# Patient Record
Sex: Female | Born: 1995
Health system: Southern US, Community
[De-identification: ages and names within clinical notes are randomized; demographics above are authoritative.]

## PROBLEM LIST (undated history)

## (undated) DIAGNOSIS — F32A Depression, unspecified: Secondary | ICD-10-CM

## (undated) DIAGNOSIS — O24419 Gestational diabetes mellitus in pregnancy, unspecified control: Secondary | ICD-10-CM

## (undated) DIAGNOSIS — F909 Attention-deficit hyperactivity disorder, unspecified type: Secondary | ICD-10-CM

## (undated) DIAGNOSIS — F419 Anxiety disorder, unspecified: Secondary | ICD-10-CM

## (undated) HISTORY — DX: Anxiety disorder, unspecified: F41.9

## (undated) HISTORY — DX: Attention-deficit hyperactivity disorder, unspecified type: F90.9

## (undated) HISTORY — DX: Gestational diabetes mellitus in pregnancy, unspecified control: O24.419

## (undated) HISTORY — DX: Depression, unspecified: F32.A

---
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
## 2017-07-24 DIAGNOSIS — Z3042 Encounter for surveillance of injectable contraceptive: Secondary | ICD-10-CM | POA: Diagnosis not present

## 2017-08-14 DIAGNOSIS — F419 Anxiety disorder, unspecified: Secondary | ICD-10-CM | POA: Diagnosis not present

## 2017-08-14 DIAGNOSIS — B359 Dermatophytosis, unspecified: Secondary | ICD-10-CM | POA: Diagnosis not present

## 2017-09-26 DIAGNOSIS — N3001 Acute cystitis with hematuria: Secondary | ICD-10-CM | POA: Diagnosis not present

## 2017-09-26 DIAGNOSIS — Z6820 Body mass index (BMI) 20.0-20.9, adult: Secondary | ICD-10-CM | POA: Diagnosis not present

## 2017-09-26 DIAGNOSIS — R350 Frequency of micturition: Secondary | ICD-10-CM | POA: Diagnosis not present

## 2017-09-26 DIAGNOSIS — Z9109 Other allergy status, other than to drugs and biological substances: Secondary | ICD-10-CM | POA: Diagnosis not present

## 2017-10-09 DIAGNOSIS — Z3042 Encounter for surveillance of injectable contraceptive: Secondary | ICD-10-CM | POA: Diagnosis not present

## 2018-01-13 DIAGNOSIS — Z3042 Encounter for surveillance of injectable contraceptive: Secondary | ICD-10-CM | POA: Diagnosis not present

## 2018-02-01 DIAGNOSIS — J069 Acute upper respiratory infection, unspecified: Secondary | ICD-10-CM | POA: Diagnosis not present

## 2018-04-11 DIAGNOSIS — F419 Anxiety disorder, unspecified: Secondary | ICD-10-CM | POA: Diagnosis not present

## 2018-04-14 DIAGNOSIS — Z3042 Encounter for surveillance of injectable contraceptive: Secondary | ICD-10-CM | POA: Diagnosis not present

## 2018-04-17 DIAGNOSIS — G44209 Tension-type headache, unspecified, not intractable: Secondary | ICD-10-CM | POA: Diagnosis not present

## 2018-05-01 DIAGNOSIS — G44209 Tension-type headache, unspecified, not intractable: Secondary | ICD-10-CM | POA: Diagnosis not present

## 2018-05-01 DIAGNOSIS — Z23 Encounter for immunization: Secondary | ICD-10-CM | POA: Diagnosis not present

## 2018-05-24 DIAGNOSIS — F419 Anxiety disorder, unspecified: Secondary | ICD-10-CM | POA: Diagnosis not present

## 2018-05-24 DIAGNOSIS — G44209 Tension-type headache, unspecified, not intractable: Secondary | ICD-10-CM | POA: Diagnosis not present

## 2018-06-27 DIAGNOSIS — R51 Headache: Secondary | ICD-10-CM | POA: Diagnosis not present

## 2018-06-30 DIAGNOSIS — Z3042 Encounter for surveillance of injectable contraceptive: Secondary | ICD-10-CM | POA: Diagnosis not present

## 2018-06-30 DIAGNOSIS — Z113 Encounter for screening for infections with a predominantly sexual mode of transmission: Secondary | ICD-10-CM | POA: Diagnosis not present

## 2018-07-12 ENCOUNTER — Ambulatory Visit: Payer: BLUE CROSS/BLUE SHIELD | Admitting: Neurology

## 2018-09-13 ENCOUNTER — Ambulatory Visit: Payer: BLUE CROSS/BLUE SHIELD | Admitting: Neurology

## 2018-10-13 DIAGNOSIS — Z3042 Encounter for surveillance of injectable contraceptive: Secondary | ICD-10-CM | POA: Diagnosis not present

## 2018-11-07 ENCOUNTER — Telehealth: Payer: Self-pay

## 2018-11-07 NOTE — Telephone Encounter (Signed)
I contacted the pt and left a vm asking pt to call me back so we could update her chart for 11/08/18 visit with Dr. Terrace Arabia.   [facility] # provided and chart updated to referral paper work sent.

## 2018-11-08 ENCOUNTER — Other Ambulatory Visit: Payer: Self-pay

## 2018-11-08 ENCOUNTER — Ambulatory Visit (INDEPENDENT_AMBULATORY_CARE_PROVIDER_SITE_OTHER): Payer: BLUE CROSS/BLUE SHIELD | Admitting: Neurology

## 2018-11-08 DIAGNOSIS — IMO0002 Reserved for concepts with insufficient information to code with codable children: Secondary | ICD-10-CM | POA: Insufficient documentation

## 2018-11-08 DIAGNOSIS — G43709 Chronic migraine without aura, not intractable, without status migrainosus: Secondary | ICD-10-CM | POA: Diagnosis not present

## 2018-11-08 DIAGNOSIS — G43909 Migraine, unspecified, not intractable, without status migrainosus: Secondary | ICD-10-CM | POA: Insufficient documentation

## 2018-11-08 MED ORDER — PROPRANOLOL HCL 40 MG PO TABS: 40.0000 mg | ORAL_TABLET | Freq: Two times a day (BID) | ORAL | 11 refills | Status: DC

## 2018-11-08 MED ORDER — SUMATRIPTAN SUCCINATE 50 MG PO TABS: 50.0000 mg | ORAL_TABLET | ORAL | 6 refills | Status: DC | PRN

## 2018-11-08 MED ORDER — ONDANSETRON 4 MG PO TBDP: 4.0000 mg | ORAL_TABLET | Freq: Three times a day (TID) | ORAL | 6 refills | Status: DC | PRN

## 2018-11-08 NOTE — Progress Notes (Signed)
PATIENT: Angela Shepard DOB: 1996/03/02  Virtual Visit via video  I connected with Angela Shepard on 11/08/18 at  by video and verified that I am speaking with the correct person using two identifiers.   I discussed the limitations, risks, security and privacy concerns of performing an evaluation and management service by video and the availability of in person appointments. I also discussed with the patient that there may be a patient responsible charge related to this service. The patient expressed understanding and agreed to proceed.  HISTORICAL  Angela Shepard is a 23 years old female, seen in request by her primary care PA Jarrett SohoWharton, Courtney for evaluation of chronic migraine headaches  I have reviewed and summarized the referring note from the referring physician.  She had a history of depression anxiety, taking BuSpar 10 mg 3 times daily, Zoloft 100, mg daily, she has been used Depo-Provera injection every 3 months for 10 years  Her typical migraine are retro-orbital area severe pounding headache with light noise sensitivity, movement made it worse, she has tried over-the-counter ibuprofen and prescription NSAIDs with limited help  Trigger for her migraines are stress, exertion, weather changes, it happens 1-2 times each week, lasting for 1 to 2 days,    Observations/Objective: I have reviewed problem lists, medications, allergies.  Awake alert oriented to history taking and casual conversation, facial were symmetric, moving 4 extremities without difficulties.  Assessment and Plan: Chronic migraine headaches  Propanolol 40 mg twice a day as migraine prevention  Imitrex 50 mg as needed, may combine it with Flexeril as needed  Follow Up Instructions:  2 months with nurse practitioner Maralyn SagoSarah    I discussed the assessment and treatment plan with the patient. The patient was provided an opportunity to ask questions and all were answered. The patient agreed with the plan and  demonstrated an understanding of the instructions.   The patient was advised to call back or seek an in-person evaluation if the symptoms worsen or if the condition fails to improve as anticipated.  I provided 30 minutes of non-face-to-face time during this encounter.  REVIEW OF SYSTEMS: Full 14 system review of systems performed and notable only for as above All other review of systems were negative.  ALLERGIES: Allergies  Allergen Reactions  . Penicillins Rash    HOME MEDICATIONS: Current Outpatient Medications  Medication Sig Dispense Refill  . busPIRone (BUSPAR) 10 MG tablet Take 10 mg by mouth 3 (three) times daily.    . cyclobenzaprine (FLEXERIL) 10 MG tablet Take 10 mg by mouth 3 (three) times daily as needed for muscle spasms.    . hydrOXYzine (ATARAX/VISTARIL) 50 MG tablet Take 50 mg by mouth 3 (three) times daily as needed.    . medroxyPROGESTERone (DEPO-PROVERA) 150 MG/ML injection Inject 150 mg into the muscle every 3 (three) months.    . sertraline (ZOLOFT) 100 MG tablet Take 100 mg by mouth daily.     No current facility-administered medications for this visit.     PAST MEDICAL HISTORY: Past Medical History:  Diagnosis Date  . ADHD     PAST SURGICAL HISTORY: No past surgical history on file.  FAMILY HISTORY: Family History  Problem Relation Age of Onset  . ADD / ADHD Father   . Arthritis Father   . Autism Brother     SOCIAL HISTORY:   Social History   Socioeconomic History  . Marital status: Single    Spouse name: Not on file  . Number of  children: Not on file  . Years of education: Not on file  . Highest education level: Not on file  Occupational History  . Not on file  Social Needs  . Financial resource strain: Not on file  . Food insecurity:    Worry: Not on file    Inability: Not on file  . Transportation needs:    Medical: Not on file    Non-medical: Not on file  Tobacco Use  . Smoking status: Never Smoker  . Smokeless tobacco:  Never Used  Substance and Sexual Activity  . Alcohol use: Never    Frequency: Never  . Drug use: Never  . Sexual activity: Not on file  Lifestyle  . Physical activity:    Days per week: Not on file    Minutes per session: Not on file  . Stress: Not on file  Relationships  . Social connections:    Talks on phone: Not on file    Gets together: Not on file    Attends religious service: Not on file    Active member of club or organization: Not on file    Attends meetings of clubs or organizations: Not on file    Relationship status: Not on file  . Intimate partner violence:    Fear of current or ex partner: Not on file    Emotionally abused: Not on file    Physically abused: Not on file    Forced sexual activity: Not on file  Other Topics Concern  . Not on file  Social History Narrative   6 serving of caffeine daily     Levert Feinstein, M.D. Ph.D.  [facility] [address] [place], [place] [address] Ph: [phone] Fax: [phone]  CC: Jarrett Soho, PA-C

## 2018-11-09 ENCOUNTER — Encounter: Payer: Self-pay | Admitting: Neurology

## 2018-11-26 DIAGNOSIS — M549 Dorsalgia, unspecified: Secondary | ICD-10-CM | POA: Diagnosis not present

## 2018-11-26 DIAGNOSIS — R109 Unspecified abdominal pain: Secondary | ICD-10-CM | POA: Diagnosis not present

## 2018-11-27 DIAGNOSIS — R109 Unspecified abdominal pain: Secondary | ICD-10-CM | POA: Diagnosis not present

## 2018-11-27 DIAGNOSIS — R110 Nausea: Secondary | ICD-10-CM | POA: Diagnosis not present

## 2018-11-29 ENCOUNTER — Ambulatory Visit: Payer: BLUE CROSS/BLUE SHIELD | Admitting: Neurology

## 2018-12-03 DIAGNOSIS — N200 Calculus of kidney: Secondary | ICD-10-CM | POA: Diagnosis not present

## 2018-12-03 DIAGNOSIS — R112 Nausea with vomiting, unspecified: Secondary | ICD-10-CM | POA: Diagnosis not present

## 2018-12-03 DIAGNOSIS — Z79899 Other long term (current) drug therapy: Secondary | ICD-10-CM | POA: Diagnosis not present

## 2018-12-03 DIAGNOSIS — R1084 Generalized abdominal pain: Secondary | ICD-10-CM | POA: Diagnosis not present

## 2018-12-04 ENCOUNTER — Other Ambulatory Visit: Payer: Self-pay | Admitting: Physician Assistant

## 2018-12-04 ENCOUNTER — Ambulatory Visit
Admission: RE | Admit: 2018-12-04 | Discharge: 2018-12-04 | Disposition: A | Discharge status: Home / Self Care | Payer: BC Managed Care – PPO | Source: Ambulatory Visit | Attending: Physician Assistant | Admitting: Physician Assistant

## 2018-12-04 DIAGNOSIS — N200 Calculus of kidney: Secondary | ICD-10-CM

## 2018-12-04 DIAGNOSIS — R1084 Generalized abdominal pain: Secondary | ICD-10-CM

## 2018-12-04 DIAGNOSIS — N132 Hydronephrosis with renal and ureteral calculous obstruction: Secondary | ICD-10-CM | POA: Diagnosis not present

## 2018-12-06 DIAGNOSIS — F419 Anxiety disorder, unspecified: Secondary | ICD-10-CM | POA: Diagnosis not present

## 2018-12-06 DIAGNOSIS — G44209 Tension-type headache, unspecified, not intractable: Secondary | ICD-10-CM | POA: Diagnosis not present

## 2018-12-10 ENCOUNTER — Observation Stay (HOSPITAL_COMMUNITY): Payer: BC Managed Care – PPO | Admitting: Anesthesiology

## 2018-12-10 ENCOUNTER — Encounter (HOSPITAL_COMMUNITY): Payer: Self-pay | Admitting: *Deleted

## 2018-12-10 ENCOUNTER — Observation Stay (HOSPITAL_COMMUNITY): Payer: BC Managed Care – PPO

## 2018-12-10 ENCOUNTER — Observation Stay (HOSPITAL_COMMUNITY)
Admission: AD | Admit: 2018-12-10 | Discharge: 2018-12-10 | Disposition: A | Discharge status: Home / Self Care | Payer: BC Managed Care – PPO | Source: Ambulatory Visit | Attending: Urology | Admitting: Urology

## 2018-12-10 ENCOUNTER — Encounter (HOSPITAL_COMMUNITY)
Admission: AD | Disposition: A | Discharge status: Home / Self Care | Payer: Self-pay | Source: Ambulatory Visit | Attending: Urology

## 2018-12-10 ENCOUNTER — Other Ambulatory Visit: Payer: Self-pay

## 2018-12-10 DIAGNOSIS — Z1159 Encounter for screening for other viral diseases: Secondary | ICD-10-CM | POA: Insufficient documentation

## 2018-12-10 DIAGNOSIS — Z79899 Other long term (current) drug therapy: Secondary | ICD-10-CM | POA: Insufficient documentation

## 2018-12-10 DIAGNOSIS — N201 Calculus of ureter: Secondary | ICD-10-CM

## 2018-12-10 DIAGNOSIS — F329 Major depressive disorder, single episode, unspecified: Secondary | ICD-10-CM | POA: Insufficient documentation

## 2018-12-10 DIAGNOSIS — Z793 Long term (current) use of hormonal contraceptives: Secondary | ICD-10-CM | POA: Insufficient documentation

## 2018-12-10 DIAGNOSIS — Z880 Allergy status to penicillin: Secondary | ICD-10-CM | POA: Diagnosis not present

## 2018-12-10 DIAGNOSIS — G43909 Migraine, unspecified, not intractable, without status migrainosus: Secondary | ICD-10-CM | POA: Diagnosis not present

## 2018-12-10 DIAGNOSIS — F419 Anxiety disorder, unspecified: Secondary | ICD-10-CM | POA: Diagnosis not present

## 2018-12-10 DIAGNOSIS — N132 Hydronephrosis with renal and ureteral calculous obstruction: Secondary | ICD-10-CM | POA: Diagnosis not present

## 2018-12-10 HISTORY — PX: CYSTOSCOPY WITH RETROGRADE PYELOGRAM, URETEROSCOPY AND STENT PLACEMENT: SHX5789

## 2018-12-10 LAB — BASIC METABOLIC PANEL
Anion gap: 10 (ref 5–15)
BUN: 11 mg/dL (ref 6–20)
CO2: 23 mmol/L (ref 22–32)
Calcium: 9.2 mg/dL (ref 8.9–10.3)
Chloride: 106 mmol/L (ref 98–111)
Creatinine, Ser: 1.08 mg/dL — ABNORMAL HIGH (ref 0.44–1.00)
GFR calc Af Amer: >60 mL/min (ref >60)
GFR calc non Af Amer: >60 mL/min (ref >60)
Glucose, Bld: 99 mg/dL (ref 70–99)
Potassium: 3.2 mmol/L — ABNORMAL LOW (ref 3.5–5.1)
Sodium: 139 mmol/L (ref 135–145)

## 2018-12-10 LAB — SURGICAL PCR SCREEN
MRSA, PCR: NEGATIVE
Staphylococcus aureus: NEGATIVE

## 2018-12-10 LAB — SARS CORONAVIRUS 2 BY RT PCR (HOSPITAL ORDER, PERFORMED IN ~~LOC~~ HOSPITAL LAB): SARS Coronavirus 2: NEGATIVE

## 2018-12-10 SURGERY — CYSTOURETEROSCOPY, WITH RETROGRADE PYELOGRAM AND STENT INSERTION
Anesthesia: General | Site: Ureter | Laterality: Left

## 2018-12-10 MED ORDER — MIDAZOLAM HCL 2 MG/2ML IJ SOLN
INTRAMUSCULAR | Status: AC
  Filled 2018-12-10: qty 2

## 2018-12-10 MED ORDER — SCOPOLAMINE 1 MG/3DAYS TD PT72
MEDICATED_PATCH | TRANSDERMAL | Status: AC
  Administered 2018-12-10: 20:00:00 via OTIC
  Filled 2018-12-10: qty 1

## 2018-12-10 MED ORDER — HYDROMORPHONE HCL 1 MG/ML IJ SOLN: 0.5000 mg | INTRAMUSCULAR | Status: DC | PRN

## 2018-12-10 MED ORDER — CIPROFLOXACIN IN D5W 400 MG/200ML IV SOLN
INTRAVENOUS | Status: AC
  Filled 2018-12-10: qty 200

## 2018-12-10 MED ORDER — PROPOFOL 10 MG/ML IV BOLUS
INTRAVENOUS | Status: DC | PRN
  Administered 2018-12-10: 200 mg via INTRAVENOUS

## 2018-12-10 MED ORDER — SODIUM CHLORIDE 0.9 % IV SOLN
INTRAVENOUS | Status: DC
  Administered 2018-12-10: 19:00:00 via INTRAVENOUS

## 2018-12-10 MED ORDER — SUMATRIPTAN SUCCINATE 50 MG PO TABS
50.0000 mg | ORAL_TABLET | ORAL | Status: DC | PRN
  Filled 2018-12-10: qty 1

## 2018-12-10 MED ORDER — MEPERIDINE HCL 50 MG/ML IJ SOLN: 6.2500 mg | INTRAMUSCULAR | Status: DC | PRN

## 2018-12-10 MED ORDER — LIDOCAINE 2% (20 MG/ML) 5 ML SYRINGE
INTRAMUSCULAR | Status: DC | PRN
  Administered 2018-12-10: 100 mg via INTRAVENOUS

## 2018-12-10 MED ORDER — ONDANSETRON 4 MG PO TBDP: 4.0000 mg | ORAL_TABLET | Freq: Three times a day (TID) | ORAL | Status: DC | PRN

## 2018-12-10 MED ORDER — BUSPIRONE HCL 5 MG PO TABS: 10.0000 mg | ORAL_TABLET | Freq: Two times a day (BID) | ORAL | Status: DC

## 2018-12-10 MED ORDER — FENTANYL CITRATE (PF) 100 MCG/2ML IJ SOLN
INTRAMUSCULAR | Status: DC | PRN
  Administered 2018-12-10: 100 ug via INTRAVENOUS

## 2018-12-10 MED ORDER — HYDROCODONE-ACETAMINOPHEN 7.5-325 MG PO TABS: 1.0000 | ORAL_TABLET | Freq: Once | ORAL | Status: DC | PRN

## 2018-12-10 MED ORDER — FENTANYL CITRATE (PF) 250 MCG/5ML IJ SOLN
INTRAMUSCULAR | Status: AC
  Filled 2018-12-10: qty 5

## 2018-12-10 MED ORDER — ONDANSETRON HCL 4 MG/2ML IJ SOLN
INTRAMUSCULAR | Status: DC | PRN
  Administered 2018-12-10: 4 mg via INTRAVENOUS

## 2018-12-10 MED ORDER — MIDAZOLAM HCL 5 MG/5ML IJ SOLN
INTRAMUSCULAR | Status: DC | PRN
  Administered 2018-12-10: 1 mg via INTRAVENOUS

## 2018-12-10 MED ORDER — 0.9 % SODIUM CHLORIDE (POUR BTL) OPTIME
TOPICAL | Status: DC | PRN
  Administered 2018-12-10: 1000 mL

## 2018-12-10 MED ORDER — PROPRANOLOL HCL 20 MG PO TABS: 40.0000 mg | ORAL_TABLET | Freq: Two times a day (BID) | ORAL | Status: DC

## 2018-12-10 MED ORDER — HYDROMORPHONE HCL 1 MG/ML IJ SOLN: 0.2500 mg | INTRAMUSCULAR | Status: DC | PRN

## 2018-12-10 MED ORDER — PROMETHAZINE HCL 25 MG PO TABS: 12.5000 mg | ORAL_TABLET | Freq: Four times a day (QID) | ORAL | Status: DC | PRN

## 2018-12-10 MED ORDER — ACETAMINOPHEN 10 MG/ML IV SOLN: 1000.0000 mg | Freq: Once | INTRAVENOUS | Status: DC | PRN

## 2018-12-10 MED ORDER — DEXAMETHASONE SODIUM PHOSPHATE 10 MG/ML IJ SOLN
INTRAMUSCULAR | Status: DC | PRN
  Administered 2018-12-10: 10 mg via INTRAVENOUS

## 2018-12-10 MED ORDER — MEDROXYPROGESTERONE ACETATE 150 MG/ML IM SUSP: 150.0000 mg | INTRAMUSCULAR | Status: DC

## 2018-12-10 MED ORDER — SERTRALINE HCL 100 MG PO TABS: 100.0000 mg | ORAL_TABLET | Freq: Every day | ORAL | Status: DC

## 2018-12-10 MED ORDER — CIPROFLOXACIN IN D5W 400 MG/200ML IV SOLN
400.0000 mg | INTRAVENOUS | Status: AC
  Administered 2018-12-10: 400 mg via INTRAVENOUS

## 2018-12-10 MED ORDER — HYDROCODONE-ACETAMINOPHEN 10-325 MG PO TABS: 1.0000 | ORAL_TABLET | Freq: Four times a day (QID) | ORAL | Status: DC | PRN

## 2018-12-10 MED ORDER — ONDANSETRON HCL 4 MG/2ML IJ SOLN: 4.0000 mg | Freq: Once | INTRAMUSCULAR | Status: DC | PRN

## 2018-12-10 MED ORDER — IOHEXOL 300 MG/ML  SOLN
INTRAMUSCULAR | Status: DC | PRN
  Administered 2018-12-10: 21:00:00 50 mL

## 2018-12-10 MED ORDER — HYDROCODONE-ACETAMINOPHEN 5-325 MG PO TABS: 1.0000 | ORAL_TABLET | Freq: Four times a day (QID) | ORAL | 0 refills | Status: DC | PRN

## 2018-12-10 MED ORDER — SODIUM CHLORIDE 0.9 % IR SOLN
Status: DC | PRN
  Administered 2018-12-10: 3000 mL

## 2018-12-10 MED ORDER — HYDROXYZINE HCL 25 MG PO TABS: 50.0000 mg | ORAL_TABLET | Freq: Every day | ORAL | Status: DC

## 2018-12-10 MED ORDER — ONDANSETRON HCL 4 MG/2ML IJ SOLN
4.0000 mg | INTRAMUSCULAR | Status: DC | PRN
  Administered 2018-12-10: 4 mg via INTRAVENOUS
  Filled 2018-12-10: qty 2

## 2018-12-10 MED ORDER — MUPIROCIN 2 % EX OINT: 1.0000 "application " | TOPICAL_OINTMENT | Freq: Two times a day (BID) | CUTANEOUS | Status: DC

## 2018-12-10 SURGICAL SUPPLY — 20 items
BAG URO CATCHER STRL LF (MISCELLANEOUS) ×2 IMPLANT
BASKET ZERO TIP NITINOL 2.4FR (BASKET) ×2 IMPLANT
CATH INTERMIT  6FR 70CM (CATHETERS) ×2 IMPLANT
CLOTH BEACON ORANGE TIMEOUT ST (SAFETY) ×2 IMPLANT
COVER WAND RF STERILE (DRAPES) IMPLANT
FIBER LASER FLEXIVA 365 (UROLOGICAL SUPPLIES) IMPLANT
FIBER LASER TRAC TIP (UROLOGICAL SUPPLIES) IMPLANT
GLOVE BIOGEL M STRL SZ7.5 (GLOVE) ×2 IMPLANT
GOWN STRL REUS W/TWL LRG LVL3 (GOWN DISPOSABLE) ×4 IMPLANT
GUIDEWIRE ANG ZIPWIRE 038X150 (WIRE) IMPLANT
GUIDEWIRE STR DUAL SENSOR (WIRE) ×2 IMPLANT
IV NS 1000ML (IV SOLUTION) ×1
IV NS 1000ML BAXH (IV SOLUTION) ×1 IMPLANT
KIT TURNOVER KIT A (KITS) IMPLANT
MANIFOLD NEPTUNE II (INSTRUMENTS) ×2 IMPLANT
PACK CYSTO (CUSTOM PROCEDURE TRAY) ×2 IMPLANT
SHEATH URETERAL 12FRX35CM (MISCELLANEOUS) IMPLANT
STENT URET 6FRX24 CONTOUR (STENTS) ×2 IMPLANT
TUBING CONNECTING 10 (TUBING) ×2 IMPLANT
TUBING UROLOGY SET (TUBING) ×2 IMPLANT

## 2018-12-10 NOTE — Transfer of Care (Signed)
Immediate Anesthesia Transfer of Care Note  Patient: Angela Shepard  Procedure(s) Performed: CYSTOSCOPY WITH RETROGRADE PYELOGRAM, URETEROSCOPY AND STENT PLACEMENT basket extraction (Left Ureter)  Patient Location: PACU  Anesthesia Type:General  Level of Consciousness: awake, alert , oriented and patient cooperative  Airway & Oxygen Therapy: Patient Spontanous Breathing and Patient connected to face mask oxygen  Post-op Assessment: Report given to RN, Post -op Vital signs reviewed and stable and Patient moving all extremities X 4  Post vital signs: stable  Last Vitals:  Vitals Value Taken Time  BP 128/75 12/10/18 2100  Temp    Pulse 93 12/10/18 2105  Resp 21 12/10/18 2105  SpO2 98 % 12/10/18 2105  Vitals shown include unvalidated device data.  Last Pain:  Vitals:   12/10/18 1944  TempSrc:   PainSc: 0-No pain         Complications: No apparent anesthesia complications

## 2018-12-10 NOTE — Op Note (Signed)
Preoperative diagnosis: Left ureteral calculus  Postoperative diagnosis: Left ureteral calculus  Procedure:  1. Cystoscopy 2. Left ureteroscopy and stone removal 3. Left ureteral stent placement (6 x 24 with string) 4. Left retrograde pyelography with interpretation  Surgeon: Pryor Curia. M.D.  Anesthesia: General  Complications: None  Intraoperative findings: Left retrograde pyelography demonstrated a filling defect within the distal left ureter consistent with the patient's known calculus without other abnormalities.  EBL: Minimal  Specimens: 1. Left ureteral calculus  Disposition of specimens: [facility] for stone analysis  Indication: Angela Shepard is a 23 y.o. year old patient with urolithiasis. After reviewing the management options for treatment, the patient elected to proceed with the above surgical procedure(s). We have discussed the potential benefits and risks of the procedure, side effects of the proposed treatment, the likelihood of the patient achieving the goals of the procedure, and any potential problems that might occur during the procedure or recuperation. Informed consent has been obtained.  Description of procedure:  The patient was taken to the operating room and general anesthesia was induced.  The patient was placed in the dorsal lithotomy position, prepped and draped in the usual sterile fashion, and preoperative antibiotics were administered. A preoperative time-out was performed.   Cystourethroscopy was performed.  The patient's urethra was examined and was normal. The bladder was then systematically examined in its entirety. There was no evidence for any bladder tumors, stones, or other mucosal pathology.    Attention then turned to the left ureteral orifice and a ureteral catheter was used to intubate the ureteral orifice.  Omnipaque contrast was injected through the ureteral catheter and a retrograde pyelogram was performed  with findings as dictated above.  A 0.38 sensor guidewire was then advanced up the left ureter into the renal pelvis under fluoroscopic guidance. The 6 Fr semirigid ureteroscope was then advanced into the ureter next to the guidewire and the calculus was identified. It was able to removed intact with an N gauge basket.  The wire was then backloaded through the cystoscope and a ureteral stent was advance over the wire using Seldinger technique.  The stent was positioned appropriately under fluoroscopic and cystoscopic guidance.  The wire was then removed with an adequate stent curl noted in the renal pelvis as well as in the bladder.  The bladder was then emptied and the procedure ended.  The patient appeared to tolerate the procedure well and without complications.  The patient was able to be awakened and transferred to the recovery unit in satisfactory condition.

## 2018-12-10 NOTE — H&P (Signed)
Office Visit Report     12/10/2018   --------------------------------------------------------------------------------   Liam RogersBrooke M. Manship  MRN: [id number]  DOB: December 09, 1995, 23 year old Female  SSN: [id number]   PRIMARY CARE:    REFERRING:  Jarrett Sohoourtney Wharton, PA  PROVIDER:  Heloise PurpuraLester Lenix Benoist, M.D.  LOCATION:  [facility] [phone]     --------------------------------------------------------------------------------   CC/HPI: Left ureteral calculus   Nehemiah SettleBrooke is a [age] year old female who presents today as a new patient for a distal left ureteral stone. She believes she had a stone episode a few years ago and likely passed a stone although this was never definitively diagnosed. Her current episode began approximately 3 weeks ago when she developed the acute onset of severe left-sided flank pain with radiation to her left abdomen. This was associated with nausea and vomiting. Her symptoms persisted and she eventually underwent a CT scan on 12/04/2018 confirming a 4 mm distal left ureteral stone. She has not been able to pass the stone over the last week and continues to have severe nausea and vomiting with poor p.o. intake. She denies any fever. She has been managing her pain with hydrocodone. She has been taking tamsulosin for medical expulsion management.     ALLERGIES: Amoxicillin Penicillin    MEDICATIONS: Buspirone Hcl  Depo-Subq Provera 104  Hydrocodone-Acetaminophen  Hydroxyzine Hcl  Imitrex  Propranolol Hcl  Zofran  Zoloft     GU PSH: None   NON-GU PSH: None   GU PMH: None   NON-GU PMH: Anxiety Depression    FAMILY HISTORY: Kidney Stones - Father, Mother   SOCIAL HISTORY: Marital Status: Single Preferred Language: English; Ethnicity: Not Hispanic Or Latino; Race: White Current Smoking Status: Patient has never smoked.   Tobacco Use Assessment Completed: Used Tobacco in last 30 days? Does not drink anymore.  Drinks 4+ caffeinated drinks per day.     REVIEW OF SYSTEMS:    GU Review Female:   Patient denies stream starts and stops, currently pregnant, trouble starting your stream, hard to postpone urination, have to strain to urinate, leakage of urine, get up at night to urinate, burning /pain with urination, and frequent urination.  Gastrointestinal (Lower):   Patient denies diarrhea and constipation.  Gastrointestinal (Upper):   Patient reports nausea and vomiting.   Constitutional:   Patient reports weight loss. Patient denies fever, night sweats, and fatigue.  Skin:   Patient denies skin rash/ lesion and itching.  Eyes:   Patient denies blurred vision and double vision.  Ears/ Nose/ Throat:   Patient denies sore throat and sinus problems.  Hematologic/Lymphatic:   Patient denies swollen glands and easy bruising.  Cardiovascular:   Patient denies leg swelling and chest pains.  Respiratory:   Patient denies cough and shortness of breath.  Endocrine:   Patient denies excessive thirst.  Musculoskeletal:   Patient denies back pain and joint pain.  Neurological:   Patient denies headaches and dizziness.  Psychologic:   Patient denies depression and anxiety.   VITAL SIGNS:      12/10/2018 02:50 PM  Weight 150 lb / 68.04 kg  Height 64 in / 162.56 cm  BP 117/77 mmHg  Pulse 84 /min  Temperature 98.4 F / 36.8 C  BMI 25.7 kg/m   MULTI-SYSTEM PHYSICAL EXAMINATION:    Constitutional: Well-nourished. No physical deformities. Normally developed. Good grooming.  Neck: Neck symmetrical, not swollen. Normal tracheal position.  Respiratory: No labored breathing, no use of accessory muscles. Clear bilaterally.  Cardiovascular: Normal temperature,  normal extremity pulses, no swelling, no varicosities. Regular rate and rhythm.  Lymphatic: No enlargement of neck, axillae, groin.  Skin: No paleness, no jaundice, no cyanosis. No lesion, no ulcer, no rash.  Neurologic / Psychiatric: Oriented to time, oriented to place, oriented to person. No  depression, no anxiety, no agitation.  Gastrointestinal: No mass, no tenderness, no rigidity, non obese abdomen. Moderate left CVA tenderness.  Eyes: Normal conjunctivae. Normal eyelids.  Ears, Nose, Mouth, and Throat: Left ear no scars, no lesions, no masses. Right ear no scars, no lesions, no masses. Nose no scars, no lesions, no masses. Normal hearing. Normal lips.  Musculoskeletal: Normal gait and station of head and neck.     PAST DATA REVIEWED:  Source Of History:  Patient  Records Review:   Previous Patient Records  Urine Test Review:   Urinalysis  X-Ray Review: C.T. Abdomen/Pelvis: Reviewed Films.    Notes:                     CLINICAL DATA: Left flank pain and hematuria     EXAM:  CT ABDOMEN AND PELVIS WITHOUT CONTRAST     TECHNIQUE:  Multidetector CT imaging of the abdomen and pelvis was performed  following the standard protocol without oral or IV contrast.     COMPARISON: None.     FINDINGS:  Lower chest: Lung bases are clear.     Hepatobiliary: No focal liver lesions are apparent on this  noncontrast enhanced study. Gallbladder wall is not appreciably  thickened. There is no biliary duct dilatation.     Pancreas: There is no evident pancreatic mass or inflammatory focus.     Spleen: No splenic lesions are evident.     Adrenals/Urinary Tract: Adrenals bilaterally appear normal. There is  no evident renal mass on either side. There is moderate  hydronephrosis on the left. There is no hydronephrosis on the right.  There is no intrarenal calculus on either side. There is a calculus  in the distal left ureter near the ureterovesical junction measuring  4 x 4 mm. No other ureteral calculi are evident on either side.  Urinary bladder is midline with wall thickness within normal limits.     Stomach/Bowel: There is no appreciable bowel wall or mesenteric  thickening. There is no evident bowel obstruction. Terminal ileum  appears unremarkable. No free air or portal  venous air.     Vascular/Lymphatic: There is no abdominal aortic aneurysm. No  vascular lesions are demonstrable on this noncontrast enhanced  study. There is no evident adenopathy in the abdomen or pelvis.     Reproductive: The uterus is anteverted. There is no demonstrable  pelvic mass.     Other: Appendix appears normal. No abscess or ascites is evident in  the abdomen or pelvis. There is a minimal umbilical hernia  containing only fat.     Musculoskeletal: There are no blastic or lytic bone lesions. No  intramuscular lesions are evident.     IMPRESSION:  1. 4 x 4 mm calculus distal left ureter near the ureterovesical  junction causing moderate hydronephrosis on the left.     2. No bowel obstruction. No abscess in the abdomen or pelvis.  Appendix appears normal.     3. Minimal umbilical hernia containing only fat.        Electronically Signed  By: Lowella Grip III M.D.  On: 12/04/2018 11:48   PROCEDURES:          Urinalysis w/Scope Dipstick Dipstick  Cont'd Micro  Color: Yellow Bilirubin: Neg mg/dL WBC/hpf: 6 - 75/IEP10/hpf  Appearance: Cloudy Ketones: Neg mg/dL RBC/hpf: 0 - 2/hpf  Specific Gravity: 1.025 Blood: Trace ery/uL Bacteria: Few (10-25/hpf)  pH: 6.0 Protein: Neg mg/dL Cystals: Amorph Urates  Glucose: Neg mg/dL Urobilinogen: 0.2 mg/dL Casts: NS (Not Seen)    Nitrites: Neg Trichomonas: Not Present    Leukocyte Esterase: 2+ leu/uL Mucous: Not Present      Epithelial Cells: 0 - 5/hpf      Yeast: NS (Not Seen)      Sperm: Not Present    ASSESSMENT:      ICD-10 Details  1 GU:   Ureteral calculus - N20.1    PLAN:           Orders Labs Urine Culture          Schedule Return Visit/Planned Activity: Other See Visit Notes             Note: Patient is going to the hospital to be admitted for surgery.          Document Letter(s):  Created for Patient: Clinical Summary         Notes:   1. Left ureteral calculus: We discussed options of ongoing medical  expulsion therapy versus intervention with either shockwave lithotripsy or ureteroscopic removal. She has had significant nausea and vomiting with very poor p.o. intake and does wish to proceed with intervention today if possible. We therefore discussed proceeding with definitive treatment with left ureteroscopy with possible laser lithotripsy and possible left ureteral stent placement. The potential risks, complications, but recovery process was discussed in detail. Informed consent was obtained.   Cc: Jarrett Sohoourtney Wharton, PA-C

## 2018-12-10 NOTE — Discharge Summary (Signed)
  Date of admission: 12/10/2018  Date of discharge: 12/10/2018  Admission diagnosis: Left ureteral calculus  Discharge diagnosis: Left ureteral calculus  Secondary diagnoses: Anxiety, depression  History and Physical: For full details, please see admission history and physical. Briefly, Angela Shepard is a 23 y.o. year old patient with a distal left ureteral stone whose pain was poorly controlled.   Hospital Course: She was admitted for IV pain control and went to the OR later that day for left ureteroscopic stone removal and stent placement.  She was able to be discharged home in stable condition postoperatively.  Laboratory values: No results for input(s): HGB, HCT in the last 72 hours. Recent Labs    12/10/18 1750  CREATININE 1.08*    Disposition: Home  Discharge instruction: The patient was instructed to be ambulatory but told to refrain from heavy lifting, strenuous activity, or driving.  Discharge medications:  Allergies as of 12/10/2018      Reactions   Penicillins Rash   Did it involve swelling of the face/tongue/throat, SOB, or low BP? Unknown Did it involve sudden or severe rash/hives, skin peeling, or any reaction on the inside of your mouth or nose? Unknown Did you need to seek medical attention at a hospital or doctor's office? Unknown When did it last happen?Childhood If all above answers are "NO", may proceed with cephalosporin use.      Medication List    STOP taking these medications   HYDROcodone-acetaminophen 10-325 MG tablet Commonly known as: NORCO Replaced by: HYDROcodone-acetaminophen 5-325 MG tablet   tamsulosin 0.4 MG Caps capsule Commonly known as: FLOMAX     TAKE these medications   busPIRone 10 MG tablet Commonly known as: BUSPAR Take 10 mg by mouth 2 (two) times daily.   HYDROcodone-acetaminophen 5-325 MG tablet Commonly known as: NORCO/VICODIN Take 1-2 tablets by mouth every 6 (six) hours as needed. Replaces:  HYDROcodone-acetaminophen 10-325 MG tablet   hydrOXYzine 50 MG tablet Commonly known as: ATARAX/VISTARIL Take 50 mg by mouth at bedtime.   medroxyPROGESTERone 150 MG/ML injection Commonly known as: DEPO-PROVERA Inject 150 mg into the muscle every 3 (three) months.   ondansetron 4 MG disintegrating tablet Commonly known as: Zofran ODT Take 1 tablet (4 mg total) by mouth every 8 (eight) hours as needed.   promethazine 12.5 MG tablet Commonly known as: PHENERGAN Take 12.5 mg by mouth every 6 (six) hours as needed for nausea or vomiting.   propranolol 40 MG tablet Commonly known as: INDERAL Take 1 tablet (40 mg total) by mouth 2 (two) times daily.   sertraline 100 MG tablet Commonly known as: ZOLOFT Take 100 mg by mouth at bedtime.   SUMAtriptan 50 MG tablet Commonly known as: Imitrex Take 1 tablet (50 mg total) by mouth every 2 (two) hours as needed for migraine. May repeat in 2 hours if headache persists or recurs.       Followup:  Follow-up Information    Raynelle Bring, MD.   Specialty: Urology Why: Office will call to schedule for 4-6 weeks Contact information: [place] [phone]

## 2018-12-10 NOTE — Anesthesia Postprocedure Evaluation (Signed)
Anesthesia Post Note  Patient: Angela Shepard  Procedure(s) Performed: CYSTOSCOPY WITH RETROGRADE PYELOGRAM, URETEROSCOPY AND STENT PLACEMENT basket extraction (Left Ureter)     Patient location during evaluation: PACU Anesthesia Type: General Level of consciousness: awake and alert Pain management: pain level controlled Vital Signs Assessment: post-procedure vital signs reviewed and stable Respiratory status: spontaneous breathing, nonlabored ventilation, respiratory function stable and patient connected to nasal cannula oxygen Cardiovascular status: blood pressure returned to baseline and stable Postop Assessment: no apparent nausea or vomiting Anesthetic complications: no    Last Vitals:  Vitals:   12/10/18 2057 12/10/18 2100  BP: 136/79 128/75  Pulse: (!) 104 92  Resp:  19  Temp: 36.8 C   SpO2: 100% 100%    Last Pain:  Vitals:   12/10/18 2057  TempSrc:   PainSc: 0-No pain                 Barnet Glasgow

## 2018-12-10 NOTE — Anesthesia Preprocedure Evaluation (Addendum)
Anesthesia Evaluation  Patient identified by MRN, date of birth, ID band Patient awake    Reviewed: Allergy & Precautions, NPO status , Patient's Chart, lab work & pertinent test results  Airway Mallampati: II  TM Distance: >3 FB Neck ROM: Full    Dental no notable dental hx. (+) Teeth Intact   Pulmonary neg pulmonary ROS,    Pulmonary exam normal breath sounds clear to auscultation       Cardiovascular Exercise Tolerance: Good negative cardio ROS Normal cardiovascular exam Rhythm:Regular Rate:Normal     Neuro/Psych  Headaches, PSYCHIATRIC DISORDERS Depression    GI/Hepatic negative GI ROS, Neg liver ROS,   Endo/Other  negative endocrine ROS  Renal/GU      Musculoskeletal negative musculoskeletal ROS (+)   Abdominal   Peds  Hematology negative hematology ROS (+)   Anesthesia Other Findings   Reproductive/Obstetrics negative OB ROS                            Anesthesia Physical Anesthesia Plan  ASA: II  Anesthesia Plan: General   Post-op Pain Management:    Induction: Intravenous  PONV Risk Score and Plan: 4 or greater and Treatment may vary due to age or medical condition, Ondansetron, Dexamethasone, Midazolam and Scopolamine patch - Pre-op  Airway Management Planned: LMA  Additional Equipment:   Intra-op Plan:   Post-operative Plan:   Informed Consent: I have reviewed the patients History and Physical, chart, labs and discussed the procedure including the risks, benefits and alternatives for the proposed anesthesia with the patient or authorized representative who has indicated his/her understanding and acceptance.     Dental advisory given  Plan Discussed with: CRNA  Anesthesia Plan Comments: (GA w LMA)       Anesthesia Quick Evaluation

## 2018-12-10 NOTE — Discharge Instructions (Addendum)
1. You may see some blood in the urine and may have some burning with urination for 48-72 hours. You also may notice that you have to urinate more frequently or urgently after your procedure which is normal.  °2. You should call should you develop an inability urinate, fever > 101, persistent nausea and vomiting that prevents you from eating or drinking to stay hydrated.  °3. If you have a stent, you will likely urinate more frequently and urgently until the stent is removed and you may experience some discomfort/pain in the lower abdomen and flank especially when urinating. You may take pain medication prescribed to you if needed for pain. You may also intermittently have blood in the urine until the stent is removed. °4.   You may remove your stent on Friday morning.  Simply pull the string that is taped to your body and the stent will easily come out.  This may be best done in the shower as some urine may come out with the stent.  Usually you will feel relief once the stent is removed, but occasionally patients can develop pain due to residual swelling of the ureter that may temporarily obstruct the kidney.  This can be managed by taking pain medication and it will typically resolve with time.  Please do not hesitate to call if you have pain that is not controlled with your pain medication or does not improved within 24-48 hours. ° °

## 2018-12-10 NOTE — Progress Notes (Signed)
Patient discharged home with mom, discharge instructions given and explained to patient and she verbalized understanding, patient denies any pain/distress, no wound noted, skin intact, accompanied home by mother.

## 2018-12-10 NOTE — Progress Notes (Signed)
Patient back from OR, alert and oriented, denies any pain nausea/vomiting. Up to toilet, voiding pink clear urine;drinking liq and keeping down.

## 2018-12-11 ENCOUNTER — Encounter (HOSPITAL_COMMUNITY): Payer: Self-pay | Admitting: Urology

## 2018-12-11 DIAGNOSIS — N201 Calculus of ureter: Secondary | ICD-10-CM | POA: Diagnosis not present

## 2018-12-11 LAB — HIV ANTIBODY (ROUTINE TESTING W REFLEX): HIV Screen 4th Generation wRfx: NONREACTIVE

## 2018-12-11 NOTE — Addendum Note (Signed)
Addendum  created 12/11/18 1859 by Lissa Morales, CRNA   Child order released for a procedure order, Clinical Note Signed, Intraprocedure Blocks edited, Intraprocedure Event edited, Intraprocedure Staff edited

## 2018-12-11 NOTE — Anesthesia Procedure Notes (Addendum)
Procedure Name: LMA Insertion Date/Time: 12/11/2018 8:24 PM Performed by: Lissa Morales, CRNA Pre-anesthesia Checklist: Patient identified, Emergency Drugs available, Suction available and Patient being monitored Patient Re-evaluated:Patient Re-evaluated prior to induction Oxygen Delivery Method: Circle system utilized Preoxygenation: Pre-oxygenation with 100% oxygen Induction Type: IV induction LMA: LMA with gastric port inserted LMA Size: 4.0 Tube type: Oral Number of attempts: 1 Placement Confirmation: ETT inserted through vocal cords under direct vision,  positive ETCO2 and breath sounds checked- equal and bilateral Tube secured with: Tape Dental Injury: Teeth and Oropharynx as per pre-operative assessment

## 2018-12-31 ENCOUNTER — Other Ambulatory Visit: Payer: Self-pay

## 2018-12-31 DIAGNOSIS — Z20822 Contact with and (suspected) exposure to covid-19: Secondary | ICD-10-CM

## 2018-12-31 DIAGNOSIS — Z1159 Encounter for screening for other viral diseases: Secondary | ICD-10-CM | POA: Diagnosis not present

## 2018-12-31 NOTE — Addendum Note (Signed)
Addended by: Gurneet Matarese M on: 12/31/2018 09:08 PM   Modules accepted: Orders  

## 2019-01-01 DIAGNOSIS — Z1159 Encounter for screening for other viral diseases: Secondary | ICD-10-CM | POA: Diagnosis not present

## 2019-01-02 LAB — NOVEL CORONAVIRUS, NAA: SARS-CoV-2, NAA: NOT DETECTED

## 2019-01-09 DIAGNOSIS — R8271 Bacteriuria: Secondary | ICD-10-CM | POA: Diagnosis not present

## 2019-01-09 DIAGNOSIS — N201 Calculus of ureter: Secondary | ICD-10-CM | POA: Diagnosis not present

## 2019-04-23 DIAGNOSIS — Z3042 Encounter for surveillance of injectable contraceptive: Secondary | ICD-10-CM | POA: Diagnosis not present

## 2019-07-06 DIAGNOSIS — Z20828 Contact with and (suspected) exposure to other viral communicable diseases: Secondary | ICD-10-CM | POA: Diagnosis not present

## 2019-07-16 DIAGNOSIS — Z3042 Encounter for surveillance of injectable contraceptive: Secondary | ICD-10-CM | POA: Diagnosis not present

## 2019-10-22 DIAGNOSIS — R87612 Low grade squamous intraepithelial lesion on cytologic smear of cervix (LGSIL): Secondary | ICD-10-CM | POA: Diagnosis not present

## 2019-10-22 DIAGNOSIS — Z01419 Encounter for gynecological examination (general) (routine) without abnormal findings: Secondary | ICD-10-CM | POA: Diagnosis not present

## 2019-10-22 DIAGNOSIS — Z3042 Encounter for surveillance of injectable contraceptive: Secondary | ICD-10-CM | POA: Diagnosis not present

## 2019-11-30 ENCOUNTER — Other Ambulatory Visit: Payer: Self-pay | Admitting: Neurology

## 2019-12-02 DIAGNOSIS — J029 Acute pharyngitis, unspecified: Secondary | ICD-10-CM | POA: Diagnosis not present

## 2019-12-02 DIAGNOSIS — Z20822 Contact with and (suspected) exposure to covid-19: Secondary | ICD-10-CM | POA: Diagnosis not present

## 2019-12-02 DIAGNOSIS — Z03818 Encounter for observation for suspected exposure to other biological agents ruled out: Secondary | ICD-10-CM | POA: Diagnosis not present

## 2020-01-04 IMAGING — CT CT ABDOMEN AND PELVIS WITHOUT CONTRAST
2 of 4 series · 11 of 46 positions shown, 12 images · non-contrast
Comparison: None.

CLINICAL DATA: Left flank pain and hematuria

EXAM:
CT ABDOMEN AND PELVIS WITHOUT CONTRAST
TECHNIQUE: Multidetector CT imaging of the abdomen and pelvis was performed
following the standard protocol without oral or IV contrast.

[Series 2: renal stone 5.00 br40 s3 axial · axial · 0.55mm/px · z∈[+1193,+1592]mm · 8 of 98 slices shown, 9 images]
[im 9/98  soft-tissue]
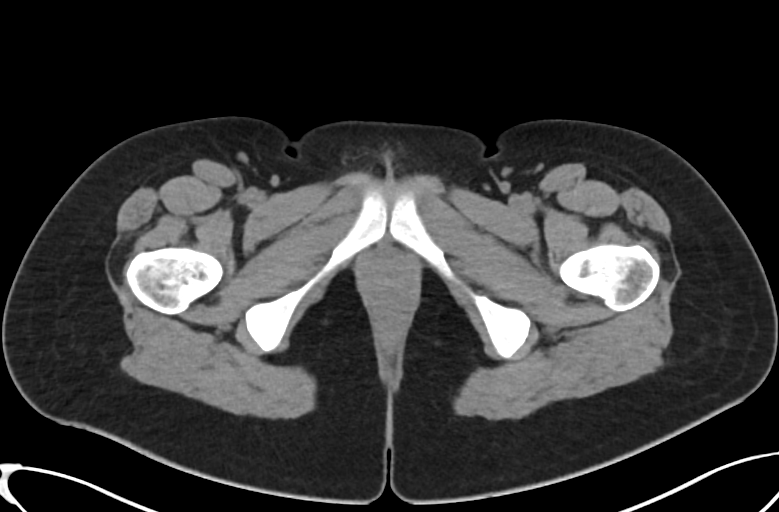
[im 9/98  bone]
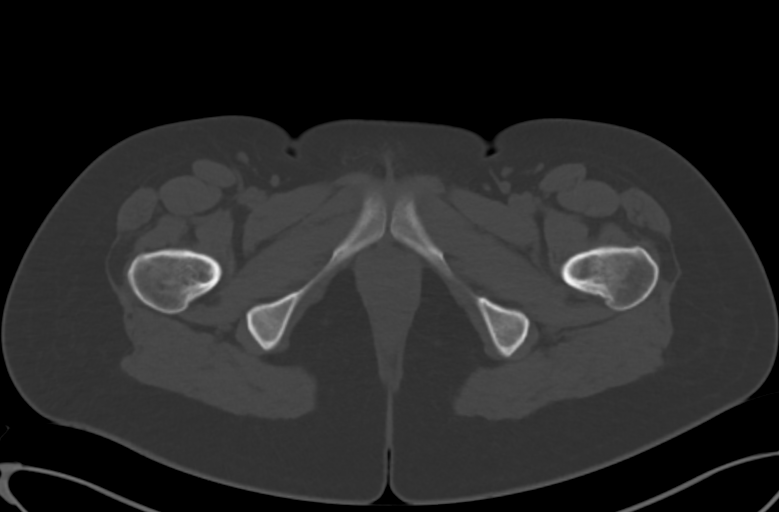
[im 22/98  soft-tissue]
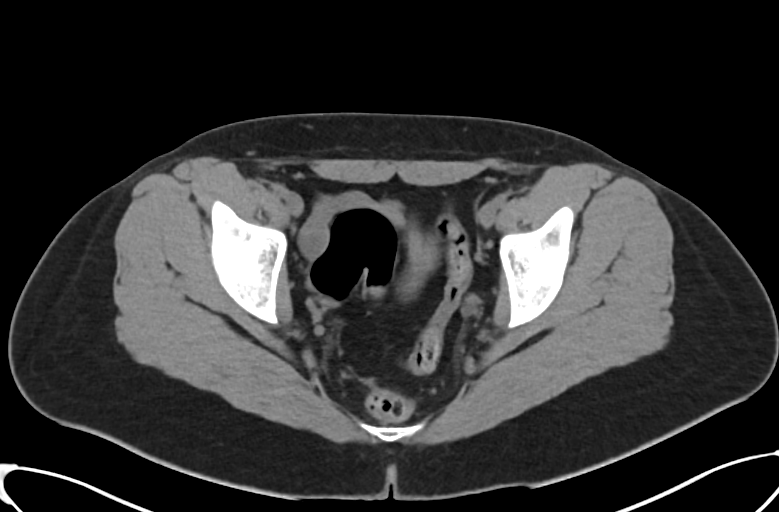
[im 30/98  soft-tissue]
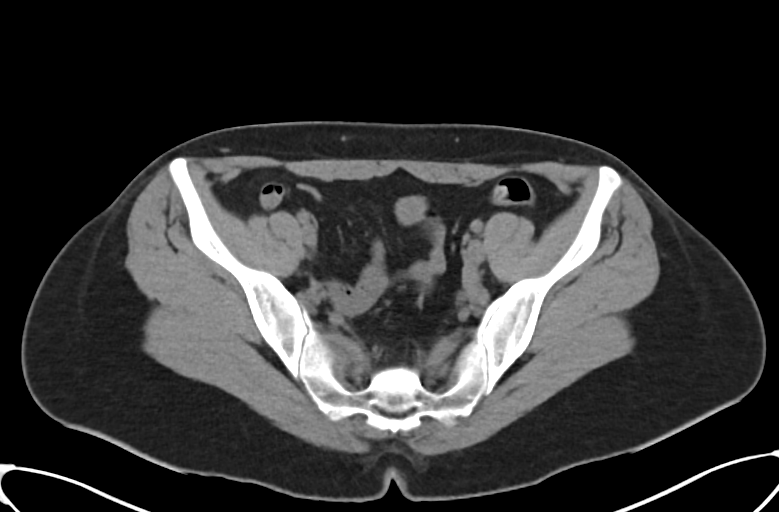
[im 43/98  soft-tissue]
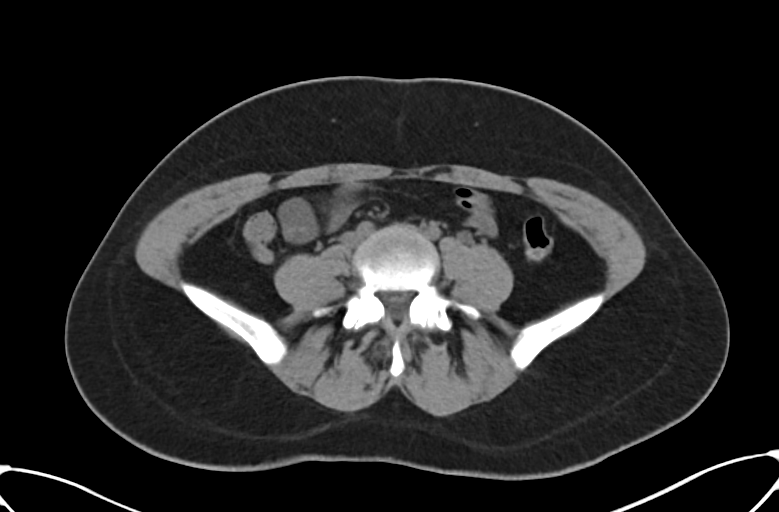
[im 55/98  soft-tissue]
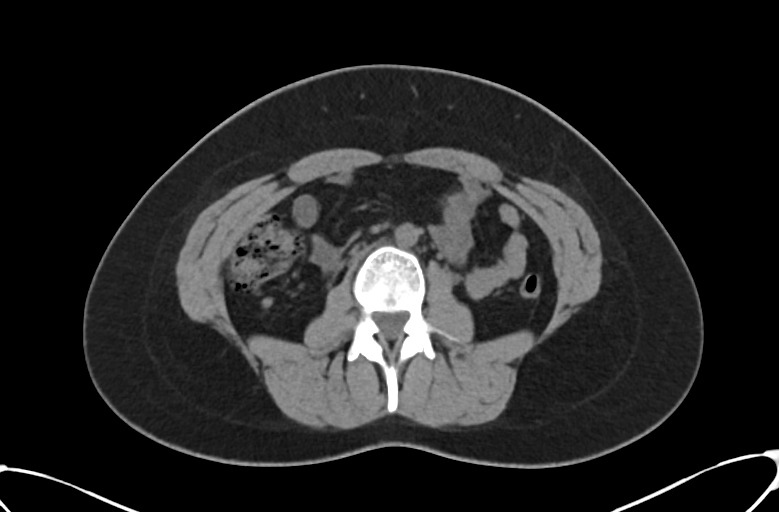
[im 68/98  soft-tissue]
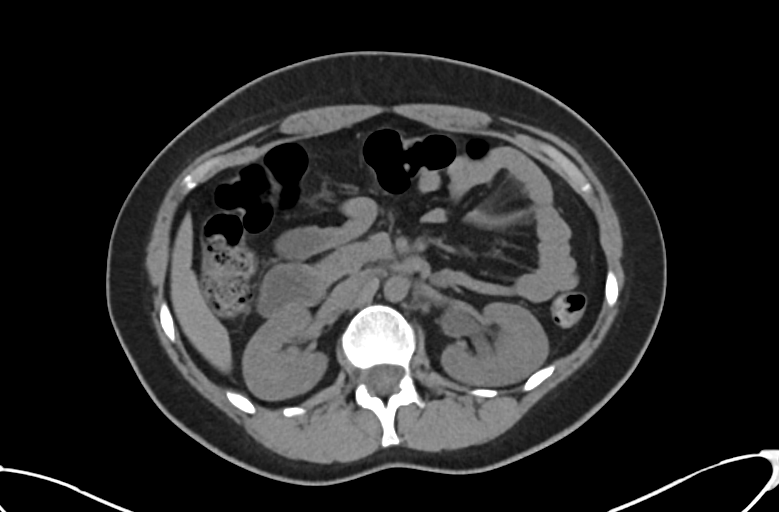
[im 76/98  soft-tissue]
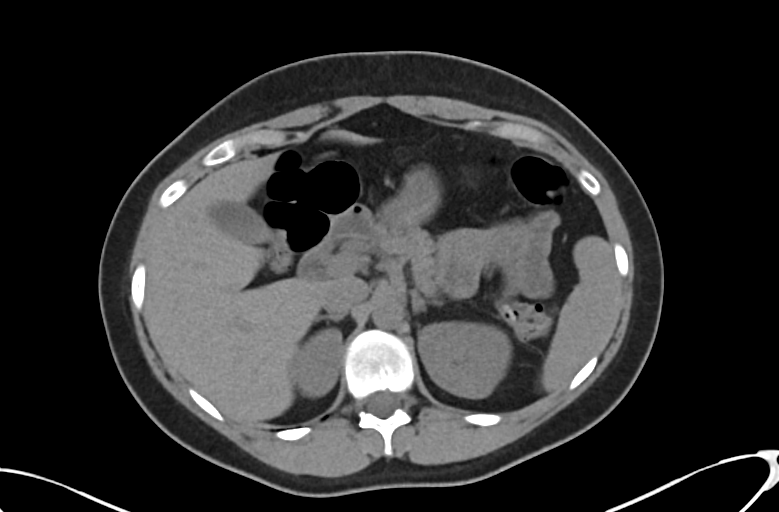
[im 89/98  soft-tissue]
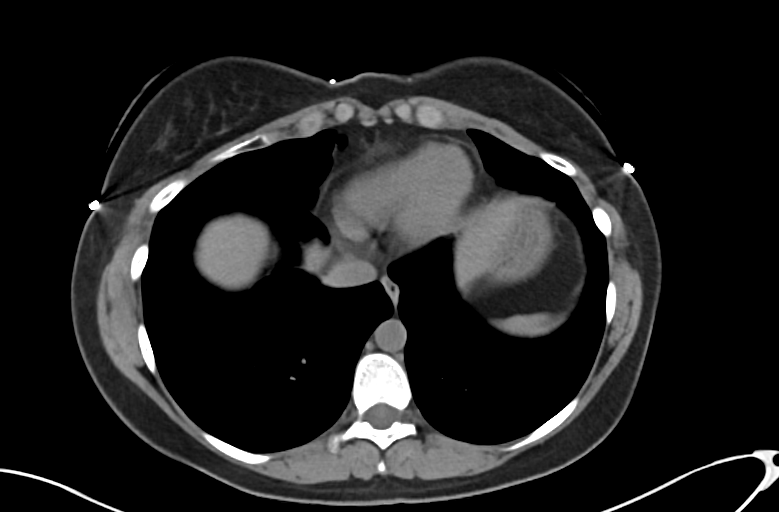

[Series 6: renal stone 2.00 br40 s3 cor · coronal · 0.83mm/px · 3 of 139 slices shown]
[im 47/139  soft-tissue]
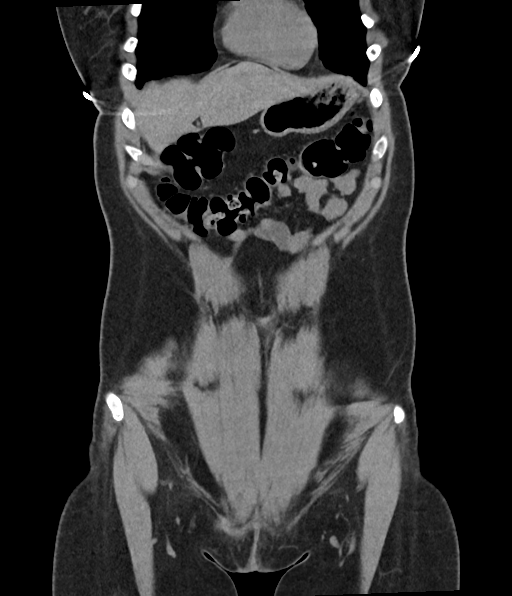
[im 62/139  soft-tissue]
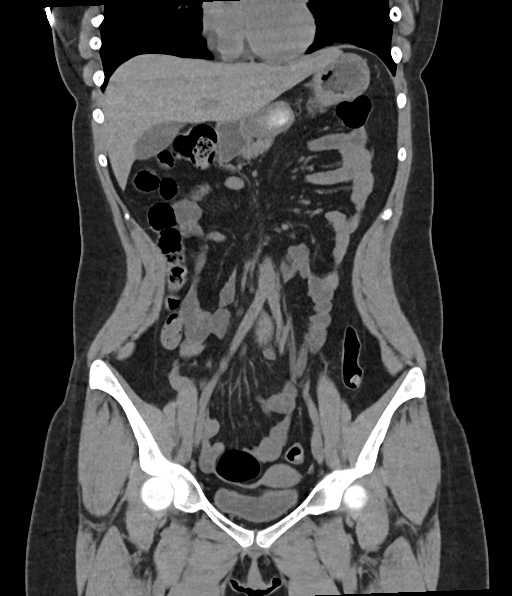
[im 77/139  soft-tissue]
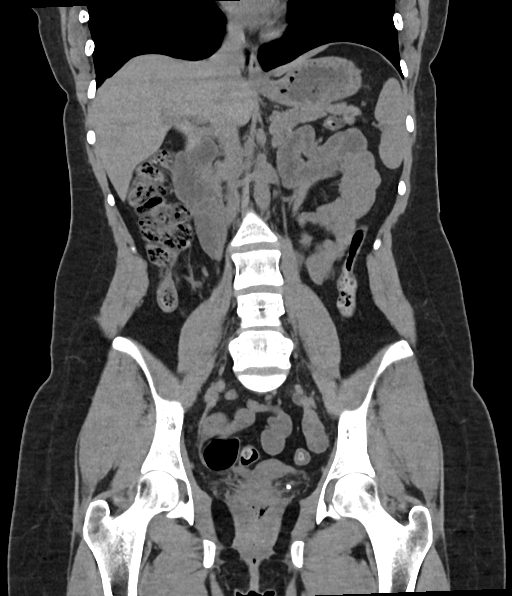

[11 of 46 positions shown; findings below may reference images not displayed]

FINDINGS: Lower chest: Lung bases are clear.

Hepatobiliary: No focal liver lesions are apparent on this
noncontrast enhanced study. Gallbladder wall is not appreciably
thickened. There is no biliary duct dilatation.

Pancreas: There is no evident pancreatic mass or inflammatory focus.

Spleen: No splenic lesions are evident.

Adrenals/Urinary Tract: Adrenals bilaterally appear normal. There is
no evident renal mass on either side. There is moderate
hydronephrosis on the left. There is no hydronephrosis on the right.
There is no intrarenal calculus on either side. There is a calculus
in the distal left ureter near the ureterovesical junction measuring
4 x 4 mm. No other ureteral calculi are evident on either side.
Urinary bladder is midline with wall thickness within normal limits.

Stomach/Bowel: There is no appreciable bowel wall or mesenteric
thickening. There is no evident bowel obstruction. Terminal ileum
appears unremarkable. No free air or portal venous air.

Vascular/Lymphatic: There is no abdominal aortic aneurysm. No
vascular lesions are demonstrable on this noncontrast enhanced
study. There is no evident adenopathy in the abdomen or pelvis.

Reproductive: The uterus is anteverted. There is no demonstrable
pelvic mass.

Other: Appendix appears normal. No abscess or ascites is evident in
the abdomen or pelvis. There is a minimal umbilical hernia
containing only fat.

Musculoskeletal: There are no blastic or lytic bone lesions. No
intramuscular lesions are evident.
IMPRESSION: 1. 4 x 4 mm calculus distal left ureter near the ureterovesical
junction causing moderate hydronephrosis on the left.

2. No bowel obstruction. No abscess in the abdomen or pelvis.
Appendix appears normal.

3.  Minimal umbilical hernia containing only fat.

## 2020-01-23 DIAGNOSIS — G44209 Tension-type headache, unspecified, not intractable: Secondary | ICD-10-CM | POA: Diagnosis not present

## 2020-01-23 DIAGNOSIS — F419 Anxiety disorder, unspecified: Secondary | ICD-10-CM | POA: Diagnosis not present

## 2020-02-04 DIAGNOSIS — Z3042 Encounter for surveillance of injectable contraceptive: Secondary | ICD-10-CM | POA: Diagnosis not present

## 2020-03-25 ENCOUNTER — Ambulatory Visit
Admission: RE | Admit: 2020-03-25 | Discharge: 2020-03-25 | Disposition: A | Discharge status: Home / Self Care | Payer: Self-pay | Source: Ambulatory Visit | Attending: Emergency Medicine | Admitting: Emergency Medicine

## 2020-03-25 ENCOUNTER — Other Ambulatory Visit: Payer: Self-pay

## 2020-03-25 VITALS — BP 106/79 | HR 101 | Temp 98.9°F | Resp 16

## 2020-03-25 DIAGNOSIS — M545 Low back pain, unspecified: Secondary | ICD-10-CM

## 2020-03-25 MED ORDER — IBUPROFEN 600 MG PO TABS: 600.0000 mg | ORAL_TABLET | Freq: Four times a day (QID) | ORAL | 0 refills | Status: DC | PRN

## 2020-03-25 MED ORDER — METHOCARBAMOL 500 MG PO TABS: 500.0000 mg | ORAL_TABLET | Freq: Two times a day (BID) | ORAL | 0 refills | Status: DC

## 2020-03-25 NOTE — ED Triage Notes (Signed)
Pt reports lumbar pain likely from heavy lifting.  Generalized pain across lower back without radiation to legs.  Has tried heating pads and ibuprofen without relief.

## 2020-03-25 NOTE — Discharge Instructions (Signed)
Take the prescribed ibuprofen as needed for your pain.  Take the muscle relaxer as needed for muscle spasm; Do not drive, operate machinery, or drink alcohol with this medication as it may make you drowsy.    Follow up with your primary care provider if your pain is not improving.       

## 2020-03-25 NOTE — ED Provider Notes (Signed)
Renaldo Fiddler    CSN: [id number] Arrival date & time: 03/25/20  1438      History   Chief Complaint Chief Complaint  Patient presents with  . Back Pain    HPI Angela Shepard is a 24 y.o. female.   Patient presents with 1 week history of lower back pain.  She attributes this to heavy lifting at work but knows of no specific trauma.  Treatment attempted at home with ibuprofen and heating pads.  She denies numbness, weakness, paresthesias, bowel/bladder incontinence, rash, lesions, abdominal pain, dysuria, or other symptoms.  Medical history includes ADHD, chronic migraines, left ureteral stone.  The history is provided by the patient.    Past Medical History:  Diagnosis Date  . ADHD     Patient Active Problem List   Diagnosis Date Noted  . Left ureteral stone 12/10/2018  . Chronic migraine 11/08/2018    Past Surgical History:  Procedure Laterality Date  . CYSTOSCOPY WITH RETROGRADE PYELOGRAM, URETEROSCOPY AND STENT PLACEMENT Left 12/10/2018   Procedure: CYSTOSCOPY WITH RETROGRADE PYELOGRAM, URETEROSCOPY AND STENT PLACEMENT basket extraction;  Surgeon: Heloise Purpura, MD;  Location: [facility];  Service: Urology;  Laterality: Left;    OB History   No obstetric history on file.      Home Medications    Prior to Admission medications   Medication Sig Start Date End Date Taking? Authorizing Provider  busPIRone (BUSPAR) 10 MG tablet Take 10 mg by mouth 2 (two) times daily.    Yes [provider]  hydrOXYzine (ATARAX/VISTARIL) 50 MG tablet Take 50 mg by mouth at bedtime.    Yes [provider]  medroxyPROGESTERone (DEPO-PROVERA) 150 MG/ML injection Inject 150 mg into the muscle every 3 (three) months.   Yes [provider]  propranolol (INDERAL) 40 MG tablet Take 1 tablet (40 mg total) by mouth 2 (two) times daily. Please call to schedule appt to continue refill or may request from PCP. 12/02/19  Yes Levert Feinstein, MD  sertraline (ZOLOFT) 100  MG tablet Take 100 mg by mouth at bedtime.    Yes [provider]  SUMAtriptan (IMITREX) 50 MG tablet Take 1 tab at onset of migraine.  May repeat in 2 hrs, if needed.  Max dose: 2 tabs/day. This is a 30 day prescription. 12/02/19  Yes Levert Feinstein, MD  HYDROcodone-acetaminophen (NORCO/VICODIN) 5-325 MG tablet Take 1-2 tablets by mouth every 6 (six) hours as needed. 12/10/18   Heloise Purpura, MD  ibuprofen (ADVIL) 600 MG tablet Take 1 tablet (600 mg total) by mouth every 6 (six) hours as needed. 03/25/20   Mickie Bail, NP  methocarbamol (ROBAXIN) 500 MG tablet Take 1 tablet (500 mg total) by mouth 2 (two) times daily. 03/25/20   Mickie Bail, NP  ondansetron (ZOFRAN ODT) 4 MG disintegrating tablet Take 1 tablet (4 mg total) by mouth every 8 (eight) hours as needed. 11/08/18   Levert Feinstein, MD  promethazine (PHENERGAN) 12.5 MG tablet Take 12.5 mg by mouth every 6 (six) hours as needed for nausea or vomiting.  12/03/18   [provider]    Family History Family History  Problem Relation Age of Onset  . ADD / ADHD Father   . Arthritis Father   . Autism Brother     Social History Social History   Tobacco Use  . Smoking status: Never Smoker  . Smokeless tobacco: Never Used  Vaping Use  . Vaping Use: Never used  Substance Use Topics  .  Alcohol use: Never  . Drug use: Never     Allergies   Penicillins   Review of Systems Review of Systems  Constitutional: Negative for chills and fever.  HENT: Negative for ear pain and sore throat.   Eyes: Negative for pain and visual disturbance.  Respiratory: Negative for cough and shortness of breath.   Cardiovascular: Negative for chest pain and palpitations.  Gastrointestinal: Negative for abdominal pain and vomiting.  Genitourinary: Negative for dysuria and hematuria.  Musculoskeletal: Positive for back pain. Negative for arthralgias and gait problem.  Skin: Negative for color change and rash.  Neurological: Negative for  seizures, syncope, weakness and numbness.  All other systems reviewed and are negative.    Physical Exam Triage Vital Signs ED Triage Vitals  Enc Vitals Group     BP      Pulse      Resp      Temp      Temp src      SpO2      Weight      Height      Head Circumference      Peak Flow      Pain Score      Pain Loc      Pain Edu?      Excl. in GC?    No data found.  Updated Vital Signs BP 106/79 (BP Location: Left Arm)   Pulse (!) 101   Temp 98.9 F (37.2 C) (Oral)   Resp 16   SpO2 98%   Visual Acuity Right Eye Distance:   Left Eye Distance:   Bilateral Distance:    Right Eye Near:   Left Eye Near:    Bilateral Near:     Physical Exam Vitals and nursing note reviewed.  Constitutional:      General: She is not in acute distress.    Appearance: She is well-developed. She is not ill-appearing.  HENT:     Head: Normocephalic and atraumatic.     Mouth/Throat:     Mouth: Mucous membranes are moist.  Eyes:     Conjunctiva/sclera: Conjunctivae normal.  Cardiovascular:     Rate and Rhythm: Normal rate and regular rhythm.     Heart sounds: No murmur heard.   Pulmonary:     Effort: Pulmonary effort is normal. No respiratory distress.     Breath sounds: Normal breath sounds.  Abdominal:     Palpations: Abdomen is soft.     Tenderness: There is no abdominal tenderness. There is no right CVA tenderness, left CVA tenderness, guarding or rebound.  Musculoskeletal:        General: No swelling, tenderness, deformity or signs of injury. Normal range of motion.     Cervical back: Neck supple.  Skin:    General: Skin is warm and dry.     Findings: No bruising, erythema, lesion or rash.  Neurological:     General: No focal deficit present.     Mental Status: She is alert and oriented to person, place, and time.     Sensory: No sensory deficit.     Motor: No weakness.     Coordination: Coordination normal.     Gait: Gait normal.  Psychiatric:        Mood and  Affect: Mood normal.        Behavior: Behavior normal.      UC Treatments / Results  Labs (all labs ordered are listed, but only abnormal results are displayed) Labs Reviewed -  No data to display  EKG   Radiology No results found.  Procedures Procedures (including critical care time)  Medications Ordered in UC Medications - No data to display  Initial Impression / Assessment and Plan / UC Course  I have reviewed the triage vital signs and the nursing notes.  Pertinent labs & imaging results that were available during my care of the patient were reviewed by me and considered in my medical decision making (see chart for details).   Acute lower back pain without sciatica.  Work note provided per patient request.  Treating with ibuprofen and Robaxin.  Precautions for drowsiness with Robaxin discussed with patient.  Instructed her to follow-up with her PCP if her symptoms are not improving.  Patient agrees to plan of care.   Final Clinical Impressions(s) / UC Diagnoses   Final diagnoses:  Acute bilateral low back pain without sciatica     Discharge Instructions     Take the prescribed ibuprofen as needed for your pain.    Take the muscle relaxer as needed for muscle spasm; Do not drive, operate machinery, or drink alcohol with this medication as it may make you drowsy.    Follow up with your primary care provider if your pain is not improving.        ED Prescriptions    Medication Sig Dispense Auth. Provider   ibuprofen (ADVIL) 600 MG tablet Take 1 tablet (600 mg total) by mouth every 6 (six) hours as needed. 30 tablet Mickie Bail, NP   methocarbamol (ROBAXIN) 500 MG tablet Take 1 tablet (500 mg total) by mouth 2 (two) times daily. 20 tablet Mickie Bail, NP     I have reviewed the PDMP during this encounter.   Mickie Bail, NP 03/25/20 1512

## 2020-06-19 ENCOUNTER — Telehealth: Payer: Self-pay | Admitting: Physician Assistant

## 2020-06-19 DIAGNOSIS — Z1152 Encounter for screening for COVID-19: Secondary | ICD-10-CM

## 2020-06-19 DIAGNOSIS — R197 Diarrhea, unspecified: Secondary | ICD-10-CM

## 2020-06-19 NOTE — Progress Notes (Signed)
note

## 2020-06-22 NOTE — Progress Notes (Signed)
E-Visit for Corona Virus Screening  Your current symptoms could be consistent with the coronavirus.  Many health care providers can now test patients at their office but not all are.  [facility] has multiple testing sites. For information on our COVID testing locations and hours go to [email or web address]  We are enrolling you in our MyChart Home Monitoring for COVID19 . Daily you will receive a questionnaire within the MyChart website. Our COVID 19 response team will be monitoring your responses daily.  Testing Information: The COVID-19 Community Testing sites are testing BY APPOINTMENT ONLY.  You can schedule online at [email or web address]  If you do not have access to a smart phone or computer you may call [phone] for an appointment.   Additional testing sites in the Community:  . For [facility] Testing sites in [facility]  [email or web address]  . For Pop[facility] testing sites in [place]  [email or web address]  . For [email or web address]  . For [facility] testing in [place] and [email or web address]  . For [facility] testing in [facility]   [email or web address]  For  more information about community testing call [phone]   Please quarantine yourself while awaiting your test results. Please stay home for a minimum of 10 days from the first day of illness with improving symptoms and you have had 24 hours of no fever (without the use of Tylenol (Acetaminophen) Motrin (Ibuprofen) or any fever reducing medication).  Also - Do not get tested prior to returning to work because once you have had a positive test the test can stay  positive for more than a month in some cases.   You should wear a mask or cloth face covering over your nose and mouth if you must be around other people or animals, including pets (even at home). Try to stay at least 6 feet away from other people. This will protect the people around you.  Please continue good preventive care measures, including:  frequent hand-washing, avoid touching your face, cover coughs/sneezes, stay out of crowds and keep a 6 foot distance from others.  COVID-19 is a respiratory illness with symptoms that are similar to the flu. Symptoms are typically mild to moderate, but there have been cases of severe illness and death due to the virus.   The following symptoms may appear 2-14 days after exposure: . Fever . Cough . Shortness of breath or difficulty breathing . Chills . Repeated shaking with chills . Muscle pain . Headache . Sore throat . New loss of taste or smell . Fatigue . Congestion or runny nose . Nausea or vomiting . Diarrhea  Go to the nearest hospital ED for assessment if fever/cough/breathlessness are severe or illness seems like a threat to life.  It is vitally important that if you feel that you have an infection such as this virus or any other virus that you stay home and away from places where you may spread it to others.  You should avoid contact with people age 49 and older.    You may also take acetaminophen (Tylenol) as needed for fever.  Reduce your risk of any infection by using the same precautions used for avoiding the common cold or flu:  Marland Kitchen Wash your hands often with soap and warm water for at least 20 seconds.  If soap and water are not readily available, use an alcohol-based hand sanitizer with at least 60% alcohol.  . If coughing or sneezing, cover your mouth and nose by coughing or sneezing  into the elbow areas of your shirt or coat, into a tissue or into your sleeve (not your hands). . Avoid shaking hands with others and consider head  nods or verbal greetings only. . Avoid touching your eyes, nose, or mouth with unwashed hands.  . Avoid close contact with people who are sick. . Avoid places or events with large numbers of people in one location, like concerts or sporting events. . Carefully consider travel plans you have or are making. . If you are planning any travel outside or inside the [place], visit the CDC's Travelers' Health webpage for the latest health notices. . If you have some symptoms but not all symptoms, continue to monitor at home and seek medical attention if your symptoms worsen. . If you are having a medical emergency, call 911.  HOME CARE . Only take medications as instructed by your medical team. . Drink plenty of fluids and get plenty of rest. . A steam or ultrasonic humidifier can help if you have congestion.   GET HELP RIGHT AWAY IF YOU HAVE EMERGENCY WARNING SIGNS** FOR COVID-19. If you or someone is showing any of these signs seek emergency medical care immediately. Call 911 or proceed to your closest emergency facility if: . You develop worsening high fever. . Trouble breathing . Bluish lips or face . Persistent pain or pressure in the chest . New confusion . Inability to wake or stay awake . You cough up blood. . Your symptoms become more severe  **This list is not all possible symptoms. Contact your medical provider for any symptoms that are sever or concerning to you.  MAKE SURE YOU   Understand these instructions.  Will watch your condition.  Will get help right away if you are not doing well or get worse.  Your e-visit answers were reviewed by a board certified advanced clinical practitioner to complete your personal care plan.  Depending on the condition, your plan could have included both over the counter or prescription medications.  If there is a problem please reply once you have received a response from your provider.  Your safety is important to [place].  If you have drug allergies  check your prescription carefully.    You can use MyChart to ask questions about today's visit, request a non-urgent call back, or ask for a work or school excuse for 24 hours related to this e-Visit. If it has been greater than 24 hours you will need to follow up with your provider, or enter a new e-Visit to address those concerns. You will get an e-mail in the next two days asking about your experience.  I hope that your e-visit has been valuable and will speed your recovery. Thank you for using e-visits.   Greater than 5 minutes, yet less than 10 minutes of time have been spent researching, coordinating and implementing care for this patient today.

## 2021-05-12 ENCOUNTER — Ambulatory Visit (INDEPENDENT_AMBULATORY_CARE_PROVIDER_SITE_OTHER): Payer: Self-pay | Admitting: Nurse Practitioner

## 2021-05-12 ENCOUNTER — Encounter: Payer: Self-pay | Admitting: Nurse Practitioner

## 2021-05-12 ENCOUNTER — Other Ambulatory Visit: Payer: Self-pay

## 2021-05-12 VITALS — BP 97/63 | HR 71 | Temp 98.2°F | Ht 64.0 in | Wt 153.8 lb

## 2021-05-12 DIAGNOSIS — F411 Generalized anxiety disorder: Secondary | ICD-10-CM

## 2021-05-12 DIAGNOSIS — Z6826 Body mass index (BMI) 26.0-26.9, adult: Secondary | ICD-10-CM

## 2021-05-12 DIAGNOSIS — Z7689 Persons encountering health services in other specified circumstances: Secondary | ICD-10-CM

## 2021-05-12 DIAGNOSIS — F409 Phobic anxiety disorder, unspecified: Secondary | ICD-10-CM

## 2021-05-12 DIAGNOSIS — G43709 Chronic migraine without aura, not intractable, without status migrainosus: Secondary | ICD-10-CM

## 2021-05-12 DIAGNOSIS — K580 Irritable bowel syndrome with diarrhea: Secondary | ICD-10-CM

## 2021-05-12 DIAGNOSIS — F5105 Insomnia due to other mental disorder: Secondary | ICD-10-CM

## 2021-05-12 DIAGNOSIS — Z23 Encounter for immunization: Secondary | ICD-10-CM

## 2021-05-12 MED ORDER — SERTRALINE HCL 100 MG PO TABS: 150.0000 mg | ORAL_TABLET | Freq: Every day | ORAL | 1 refills | Status: DC

## 2021-05-12 MED ORDER — PROPRANOLOL HCL ER 60 MG PO CP24: 60.0000 mg | ORAL_CAPSULE | Freq: Every day | ORAL | 1 refills | Status: DC

## 2021-05-12 MED ORDER — BUSPIRONE HCL 15 MG PO TABS: 15.0000 mg | ORAL_TABLET | Freq: Two times a day (BID) | ORAL | 1 refills | Status: DC | PRN

## 2021-05-12 MED ORDER — DICYCLOMINE HCL 10 MG PO CAPS: 10.0000 mg | ORAL_CAPSULE | Freq: Three times a day (TID) | ORAL | 1 refills | Status: DC

## 2021-05-12 MED ORDER — HYDROXYZINE HCL 50 MG PO TABS: 50.0000 mg | ORAL_TABLET | Freq: Every day | ORAL | 1 refills | Status: DC

## 2021-05-12 MED ORDER — ELETRIPTAN HYDROBROMIDE 40 MG PO TABS: 40.0000 mg | ORAL_TABLET | ORAL | 1 refills | Status: DC | PRN

## 2021-05-12 NOTE — Progress Notes (Signed)
New Patient Office Visit  Subjective:  Patient ID: Angela Shepard, female    DOB: 09-06-1995  Age: 25 y.o. MRN: [id number]  CC:  Chief Complaint  Patient presents with   New Patient (Initial Visit)    HPI Angela Shepard presents to establish new primary care provider. Has not seen a primary care provider since the start of COVID. She has continued to see her GYN provider. No longer taking depo-provera. Has been off of this for about a year. She is not actively trying to get pregnant. Not preventing pregnancy either. She does get migraines so birth control with estrogen in them is contraindicated. She feels as though migraines are becoming more severe and more frequent. She does take propranolol to prevent migraines. This did help at first, but has been on this dose for about a year and it is no longer helping. She doesn't feel as though imitrex is really gt rid of migraines completely. She states that she is getting three headache days during the work week.  She has history of generalized anxiety. She has been treated for this since she was a teenager. She is on zoloft 100mg  daily and taking buspirone 10mg  twice daily. She states she is having intermittent panic attacks during the day. She states that taking the buspirone does help reduce the acute anxiety. Feels like her overall anxiety is increasing. She also takes hydroxyzine at night to help with anxiety and sleep.   Past Medical History:  Diagnosis Date   ADHD     Past Surgical History:  Procedure Laterality Date   CYSTOSCOPY WITH RETROGRADE PYELOGRAM, URETEROSCOPY AND STENT PLACEMENT Left 12/10/2018   Procedure: CYSTOSCOPY WITH RETROGRADE PYELOGRAM, URETEROSCOPY AND STENT PLACEMENT basket extraction;  Surgeon: , MD;  Location: [facility];  Service: Urology;  Laterality: Left;    Family History  Problem Relation Age of Onset   ADD / ADHD Father    Arthritis Father    Autism Brother     Social History    Socioeconomic History   Marital status: Single    Spouse name: Not on file   Number of children: Not on file   Years of education: Not on file   Highest education level: Not on file  Occupational History   Not on file  Tobacco Use   Smoking status: Never   Smokeless tobacco: Never  Vaping Use   Vaping Use: Every day  Substance and Sexual Activity   Alcohol use: Never   Drug use: Never   Sexual activity: Yes    Partners: Male  Other Topics Concern   Not on file  Social History Narrative   6 serving of caffeine daily    Social Determinants of Health   Financial Resource Strain: Not on file  Food Insecurity: Not on file  Transportation Needs: Not on file  Physical Activity: Not on file  Stress: Not on file  Social Connections: Not on file  Intimate Partner Violence: Not on file    ROS Review of Systems  Constitutional:  Positive for fatigue. Negative for activity change, appetite change, chills and fever.  HENT:  Negative for congestion, postnasal drip, rhinorrhea, sinus pressure, sinus pain, sneezing and sore throat.   Eyes: Negative.   Respiratory:  Negative for cough, chest tightness, shortness of breath and wheezing.   Cardiovascular:  Negative for chest pain and palpitations.  Gastrointestinal:  Positive for diarrhea. Negative for abdominal pain, constipation, nausea and vomiting.  Intermittent cramping and diarrhea.  Generally associated with increased anxiety and stress.  Endocrine: Negative for cold intolerance, heat intolerance, polydipsia and polyuria.  Genitourinary:  Negative for dyspareunia, dysuria, flank pain, frequency and urgency.  Musculoskeletal:  Negative for arthralgias, back pain and myalgias.  Skin:  Negative for rash.  Allergic/Immunologic: Negative for environmental allergies.  Neurological:  Positive for headaches. Negative for dizziness and weakness.       More frequent and more severe migraine headaches   Hematological:  Negative for  adenopathy.  Psychiatric/Behavioral:  The patient is not nervous/anxious.    Objective:   Today's Vitals   05/12/21 1338  BP: 97/63  Pulse: 71  Temp: 98.2 F (36.8 C)  SpO2: 99%  Weight: 153 lb 12.8 oz (69.8 kg)  Height: 5\' 4"  (1.626 m)   Body mass index is 26.4 kg/m.   Physical Exam Vitals and nursing note reviewed.  Constitutional:      Appearance: Normal appearance. She is well-developed.  HENT:     Head: Normocephalic and atraumatic.  Eyes:     Pupils: Pupils are equal, round, and reactive to light.  Cardiovascular:     Rate and Rhythm: Normal rate and regular rhythm.     Pulses: Normal pulses.     Heart sounds: Normal heart sounds.  Pulmonary:     Effort: Pulmonary effort is normal.     Breath sounds: Normal breath sounds.  Abdominal:     Palpations: Abdomen is soft.  Musculoskeletal:        General: Normal range of motion.     Cervical back: Normal range of motion and neck supple.  Lymphadenopathy:     Cervical: No cervical adenopathy.  Skin:    General: Skin is warm and dry.     Capillary Refill: Capillary refill takes less than 2 seconds.  Neurological:     General: No focal deficit present.     Mental Status: She is alert and oriented to person, place, and time.  Psychiatric:        Mood and Affect: Mood normal.        Behavior: Behavior normal.        Thought Content: Thought content normal.        Judgment: Judgment normal.    Assessment & Plan:  1. Encounter to establish care Appointment today to establish new primary care provider    2. Chronic migraine without aura without status migrainosus, not intractable Will change prescription for propranolol to propranolol ER 60 mg capsules daily.  Change Imitrex to Relpax 40 mg tablets.  May take once at the beginning of migraine and repeat in 2 hours if needed for persistent headache.  We will reassess at next visit. - eletriptan (RELPAX) 40 MG tablet; Take 1 tablet (40 mg total) by mouth as needed for  migraine or headache. May repeat in 2 hours if headache persists or recurs.  Dispense: 10 tablet; Refill: 1 - propranolol ER (INDERAL LA) 60 MG 24 hr capsule; Take 1 capsule (60 mg total) by mouth daily.  Dispense: 30 capsule; Refill: 1  3. Irritable bowel syndrome with diarrhea New prescription for dicyclomine 10 mg.  This may be taken up to 4 times daily as needed for abdominal cramping and diarrhea. - dicyclomine (BENTYL) 10 MG capsule; Take 1 capsule (10 mg total) by mouth 4 (four) times daily -  before meals and at bedtime.  Dispense: 90 capsule; Refill: 1  4. Generalized anxiety disorder Increase sertraline to 150 mg daily.  We will also increase buspirone to 15 mg twice daily as needed for acute anxiety. - busPIRone (BUSPAR) 15 MG tablet; Take 1 tablet (15 mg total) by mouth 2 (two) times daily as needed.  Dispense: 60 tablet; Refill: 1 - sertraline (ZOLOFT) 100 MG tablet; Take 1.5 tablets (150 mg total) by mouth at bedtime.  Dispense: 45 tablet; Refill: 1  5. Insomnia due to anxiety and fear Continue hydroxyzine 50 mg as previously prescribed. - hydrOXYzine (ATARAX) 50 MG tablet; Take 1 tablet (50 mg total) by mouth at bedtime.  Dispense: 30 tablet; Refill: 1  6. Body mass index 26.0-26.9, adult Discussed lowering calorie intake to 1500 calories per day and incorporating exercise into daily routine to help lose weight.   7. Need for influenza vaccination Flu vaccine administered during today's visit. - Flu Vaccine QUAD 6+ mos PF IM (Fluarix Quad PF)   Problem List Items Addressed This Visit       Cardiovascular and Mediastinum   Chronic migraine without aura without status migrainosus, not intractable   Relevant Medications   sertraline (ZOLOFT) 100 MG tablet   eletriptan (RELPAX) 40 MG tablet   propranolol ER (INDERAL LA) 60 MG 24 hr capsule     Digestive   Irritable bowel syndrome with diarrhea   Relevant Medications   dicyclomine (BENTYL) 10 MG capsule     Other    Generalized anxiety disorder   Relevant Medications   busPIRone (BUSPAR) 15 MG tablet   hydrOXYzine (ATARAX) 50 MG tablet   sertraline (ZOLOFT) 100 MG tablet   Insomnia due to anxiety and fear   Relevant Medications   hydrOXYzine (ATARAX) 50 MG tablet   Body mass index 26.0-26.9, adult   Other Visit Diagnoses     Encounter to establish care    -  Primary   Need for influenza vaccination       Relevant Orders   Flu Vaccine QUAD 6+ mos PF IM (Fluarix Quad PF) (Completed)       Outpatient Encounter Medications as of 05/12/2021  Medication Sig   dicyclomine (BENTYL) 10 MG capsule Take 1 capsule (10 mg total) by mouth 4 (four) times daily -  before meals and at bedtime.   eletriptan (RELPAX) 40 MG tablet Take 1 tablet (40 mg total) by mouth as needed for migraine or headache. May repeat in 2 hours if headache persists or recurs.   propranolol ER (INDERAL LA) 60 MG 24 hr capsule Take 1 capsule (60 mg total) by mouth daily.   [DISCONTINUED] busPIRone (BUSPAR) 10 MG tablet Take 10 mg by mouth 2 (two) times daily.    [DISCONTINUED] hydrOXYzine (ATARAX/VISTARIL) 50 MG tablet Take 50 mg by mouth at bedtime.    [DISCONTINUED] propranolol (INDERAL) 40 MG tablet Take 1 tablet (40 mg total) by mouth 2 (two) times daily. Please call to schedule appt to continue refill or may request from PCP.   [DISCONTINUED] sertraline (ZOLOFT) 100 MG tablet Take 100 mg by mouth at bedtime.    [DISCONTINUED] SUMAtriptan (IMITREX) 50 MG tablet Take 1 tab at onset of migraine.  May repeat in 2 hrs, if needed.  Max dose: 2 tabs/day. This is a 30 day prescription.   busPIRone (BUSPAR) 15 MG tablet Take 1 tablet (15 mg total) by mouth 2 (two) times daily as needed.   hydrOXYzine (ATARAX) 50 MG tablet Take 1 tablet (50 mg total) by mouth at bedtime.   sertraline (ZOLOFT) 100 MG tablet Take 1.5 tablets (150 mg total) by mouth  at bedtime.   [DISCONTINUED] HYDROcodone-acetaminophen (NORCO/VICODIN) 5-325 MG tablet Take 1-2  tablets by mouth every 6 (six) hours as needed.   [DISCONTINUED] ibuprofen (ADVIL) 600 MG tablet Take 1 tablet (600 mg total) by mouth every 6 (six) hours as needed.   [DISCONTINUED] medroxyPROGESTERone (DEPO-PROVERA) 150 MG/ML injection Inject 150 mg into the muscle every 3 (three) months.   [DISCONTINUED] methocarbamol (ROBAXIN) 500 MG tablet Take 1 tablet (500 mg total) by mouth 2 (two) times daily.   [DISCONTINUED] ondansetron (ZOFRAN ODT) 4 MG disintegrating tablet Take 1 tablet (4 mg total) by mouth every 8 (eight) hours as needed.   [DISCONTINUED] promethazine (PHENERGAN) 12.5 MG tablet Take 12.5 mg by mouth every 6 (six) hours as needed for nausea or vomiting.    No facility-administered encounter medications on file as of 05/12/2021.    Follow-up: Return in about 6 weeks (around 06/23/2021) for igraines, anxiety.   Carlean Jews, NP  This note was dictated using Conservation officer, historic buildings. Rapid proofreading was performed to expedite the delivery of the information. Despite proofreading, phonetic errors will occur which are common with this voice recognition software. Please take this into consideration. If there are any concerns, please contact our office.

## 2021-05-16 DIAGNOSIS — G43709 Chronic migraine without aura, not intractable, without status migrainosus: Secondary | ICD-10-CM | POA: Insufficient documentation

## 2021-05-16 DIAGNOSIS — F409 Phobic anxiety disorder, unspecified: Secondary | ICD-10-CM | POA: Insufficient documentation

## 2021-05-16 DIAGNOSIS — Z6826 Body mass index (BMI) 26.0-26.9, adult: Secondary | ICD-10-CM | POA: Insufficient documentation

## 2021-05-16 DIAGNOSIS — K580 Irritable bowel syndrome with diarrhea: Secondary | ICD-10-CM | POA: Insufficient documentation

## 2021-05-16 DIAGNOSIS — F411 Generalized anxiety disorder: Secondary | ICD-10-CM | POA: Insufficient documentation

## 2021-05-16 DIAGNOSIS — F5105 Insomnia due to other mental disorder: Secondary | ICD-10-CM

## 2021-05-16 HISTORY — DX: Insomnia due to other mental disorder: F51.05

## 2021-05-16 HISTORY — DX: Phobic anxiety disorder, unspecified: F40.9

## 2021-05-16 HISTORY — DX: Body mass index (BMI) 26.0-26.9, adult: Z68.26

## 2021-05-16 NOTE — Patient Instructions (Signed)
Fat and Cholesterol Restricted Eating Plan Getting too much fat and cholesterol in your diet may cause health problems. Choosing the right foods helps keep your fat and cholesterol at normal levels. This can keep you from getting certain diseases. Your doctor may recommend an eating plan that includes: Total fat: ______% or less of total calories a day. This is ______g of fat a day. Saturated fat: ______% or less of total calories a day. This is ______g of saturated fat a day. Cholesterol: less than _________mg a day. Fiber: ______g a day. What are tips for following this plan? General tips Work with your doctor to lose weight if you need to. Avoid: Foods with added sugar. Fried foods. Foods with trans fat or partially hydrogenated oils. This includes some margarines and baked goods. If you drink alcohol: Limit how much you have to: 0-1 drink a day for women who are not pregnant. 0-2 drinks a day for men. Know how much alcohol is in a drink. In the [place] one drink equals one 12 oz bottle of beer (355 mL), one 5 oz glass of wine (148 mL), or one 1 oz glass of hard liquor (44 mL). Reading food labels Check food labels for: Trans fats. Partially hydrogenated oils. Saturated fat (g) in each serving. Cholesterol (mg) in each serving. Fiber (g) in each serving. Choose foods with healthy fats, such as: Monounsaturated fats and polyunsaturated fats. These include olive and canola oil, flaxseeds, walnuts, almonds, and seeds. Omega-3 fats. These are found in certain fish, flaxseed oil, and ground flaxseeds. Choose grain products that have whole grains. Look for the word "whole" as the first word in the ingredient list. Cooking Cook foods using low-fat methods. These include baking, boiling, grilling, and broiling. Eat more home-cooked foods. Eat at restaurants and buffets less often. Eat less fast food. Avoid cooking using saturated fats, such as butter, cream, palm oil, palm kernel oil, and  coconut oil. Meal planning  At meals, divide your plate into four equal parts: Fill one-half of your plate with vegetables, green salads, and fruit. Fill one-fourth of your plate with whole grains. Fill one-fourth of your plate with low-fat (lean) protein foods. Eat fish that is high in omega-3 fats at least two times a week. This includes mackerel, tuna, sardines, and salmon. Eat foods that are high in fiber, such as whole grains, beans, apples, pears, berries, broccoli, carrots, peas, and barley. What foods should I eat? Fruits All fresh, canned (in natural juice), or frozen fruits. Vegetables Fresh or frozen vegetables (raw, steamed, roasted, or grilled). Green salads. Grains Whole grains, such as whole wheat or whole grain breads, crackers, cereals, and pasta. Unsweetened oatmeal, bulgur, barley, quinoa, or brown rice. Corn or whole wheat flour tortillas. Meats and other protein foods Ground beef (85% or leaner), grass-fed beef, or beef trimmed of fat. Skinless chicken or turkey. Ground chicken or turkey. Pork trimmed of fat. All fish and seafood. Egg whites. Dried beans, peas, or lentils. Unsalted nuts or seeds. Unsalted canned beans. Nut butters without added sugar or oil. Dairy Low-fat or nonfat dairy products, such as skim or 1% milk, 2% or reduced-fat cheeses, low-fat and fat-free ricotta or cottage cheese, or plain low-fat and nonfat yogurt. Fats and oils Tub margarine without trans fats. Light or reduced-fat mayonnaise and salad dressings. Avocado. Olive, canola, sesame, or safflower oils. The items listed above may not be a complete list of foods and beverages you can eat. Contact a dietitian for more information. What foods   should I avoid? Fruits Canned fruit in heavy syrup. Fruit in cream or butter sauce. Fried fruit. Vegetables Vegetables cooked in cheese, cream, or butter sauce. Fried vegetables. Grains White bread. White pasta. White rice. Cornbread. Bagels, pastries,  and croissants. Crackers and snack foods that contain trans fat and hydrogenated oils. Meats and other protein foods Fatty cuts of meat. Ribs, chicken wings, bacon, sausage, bologna, salami, chitterlings, fatback, hot dogs, bratwurst, and packaged lunch meats. Liver and organ meats. Whole eggs and egg yolks. Chicken and turkey with skin. Fried meat. Dairy Whole or 2% milk, cream, half-and-half, and cream cheese. Whole milk cheeses. Whole-fat or sweetened yogurt. Full-fat cheeses. Nondairy creamers and whipped toppings. Processed cheese, cheese spreads, and cheese curds. Fats and oils Butter, stick margarine, lard, shortening, ghee, or bacon fat. Coconut, palm kernel, and palm oils. Beverages Alcohol. Sugar-sweetened drinks such as sodas, lemonade, and fruit drinks. Sweets and desserts Corn syrup, sugars, honey, and molasses. Candy. Jam and jelly. Syrup. Sweetened cereals. Cookies, pies, cakes, donuts, muffins, and ice cream. The items listed above may not be a complete list of foods and beverages you should avoid. Contact a dietitian for more information. Summary Choosing the right foods helps keep your fat and cholesterol at normal levels. This can keep you from getting certain diseases. At meals, fill one-half of your plate with vegetables, green salads, and fruits. Eat high fiber foods, like whole grains, beans, apples, pears, berries, carrots, peas, and barley. Limit added sugar, saturated fats, alcohol, and fried foods. This information is not intended to replace advice given to you by your health care provider. Make sure you discuss any questions you have with your health care provider. Document Revised: 10/09/2020 Document Reviewed: 10/09/2020 Elsevier Patient Education  2022 Elsevier Inc.  

## 2021-06-23 ENCOUNTER — Ambulatory Visit: Payer: Self-pay | Admitting: Nurse Practitioner

## 2021-07-07 ENCOUNTER — Other Ambulatory Visit: Payer: Self-pay

## 2021-07-07 ENCOUNTER — Ambulatory Visit (INDEPENDENT_AMBULATORY_CARE_PROVIDER_SITE_OTHER): Payer: 59 | Admitting: Physician Assistant

## 2021-07-07 ENCOUNTER — Encounter: Payer: Self-pay | Admitting: Physician Assistant

## 2021-07-07 VITALS — BP 106/71 | HR 76 | Temp 98.0°F | Ht 64.0 in | Wt 152.0 lb

## 2021-07-07 DIAGNOSIS — J029 Acute pharyngitis, unspecified: Secondary | ICD-10-CM

## 2021-07-07 DIAGNOSIS — R051 Acute cough: Secondary | ICD-10-CM

## 2021-07-07 DIAGNOSIS — J069 Acute upper respiratory infection, unspecified: Secondary | ICD-10-CM

## 2021-07-07 LAB — POCT INFLUENZA A/B
Influenza A, POC: NEGATIVE
Influenza B, POC: NEGATIVE

## 2021-07-07 LAB — POCT RAPID STREP A (OFFICE): Rapid Strep A Screen: NEGATIVE

## 2021-07-07 MED ORDER — METHYLPREDNISOLONE 4 MG PO TBPK: ORAL_TABLET | ORAL | 0 refills | Status: DC

## 2021-07-07 NOTE — Progress Notes (Signed)
Established patient acute visit   Patient: Angela Shepard   DOB: 1995-08-29   25 y.o. Female  MRN: [id number] Visit Date: 07/07/2021  Chief Complaint  Patient presents with   Acute Visit   Cough   Nasal Congestion   Subjective    HPI  Patient presents with c/o postnasal drainage, sore throat, nasal congestion, sinus pressure, runny nose, cough, and left earache. Symptoms started Monday. No body aches, wheezing, shortness of breath, fever, n/v/d. Has been taking Sinuex and using nasal spray without significant relief. Symptoms started 2 days ago. States did a Covid test yesterday which resulted negative. Patient reports her fiance had a sinus infection last week.     Medications: Outpatient Medications Prior to Visit  Medication Sig   busPIRone (BUSPAR) 15 MG tablet Take 1 tablet (15 mg total) by mouth 2 (two) times daily as needed.   dicyclomine (BENTYL) 10 MG capsule Take 1 capsule (10 mg total) by mouth 4 (four) times daily -  before meals and at bedtime.   eletriptan (RELPAX) 40 MG tablet Take 1 tablet (40 mg total) by mouth as needed for migraine or headache. May repeat in 2 hours if headache persists or recurs.   hydrOXYzine (ATARAX) 50 MG tablet Take 1 tablet (50 mg total) by mouth at bedtime.   propranolol ER (INDERAL LA) 60 MG 24 hr capsule Take 1 capsule (60 mg total) by mouth daily.   sertraline (ZOLOFT) 100 MG tablet Take 1.5 tablets (150 mg total) by mouth at bedtime.   No facility-administered medications prior to visit.    Review of Systems Review of Systems:  A fourteen system review of systems was performed and found to be positive as per HPI.     Objective    BP 106/71    Pulse 76    Temp 98 F (36.7 C)    Ht 5\' 4"  (1.626 m)    Wt 152 lb (68.9 kg)    SpO2 98%    BMI 26.09 kg/m    Physical Exam Constitutional:      General: She is not in acute distress.    Appearance: She is ill-appearing.  HENT:     Head: Normocephalic and atraumatic.     Right  Ear: Ear canal and external ear normal. A middle ear effusion is present. There is no impacted cerumen. Tympanic membrane is not erythematous.     Left Ear: Ear canal and external ear normal.  No middle ear effusion. There is no impacted cerumen. Tympanic membrane is not erythematous.     Nose: Congestion present.     Right Turbinates: Swollen.     Left Turbinates: Swollen.     Right Sinus: Maxillary sinus tenderness and frontal sinus tenderness present.     Left Sinus: Maxillary sinus tenderness and frontal sinus tenderness present.     Mouth/Throat:     Mouth: Mucous membranes are moist.     Pharynx: Oropharyngeal exudate and posterior oropharyngeal erythema present.  Eyes:     Conjunctiva/sclera: Conjunctivae normal.     Pupils: Pupils are equal, round, and reactive to light.  Cardiovascular:     Rate and Rhythm: Normal rate and regular rhythm.     Pulses: Normal pulses.     Heart sounds: Normal heart sounds.  Pulmonary:     Effort: Pulmonary effort is normal.     Breath sounds: Normal breath sounds. No wheezing or rales.  Musculoskeletal:     Cervical back: Neck supple.  Lymphadenopathy:  Cervical: No cervical adenopathy.  Skin:    General: Skin is warm and dry.  Neurological:     General: No focal deficit present.     Mental Status: She is alert.  Psychiatric:        Mood and Affect: Mood normal.        Behavior: Behavior normal.      Results for orders placed or performed in visit on 07/07/21  POCT Influenza A/B  Result Value Ref Range   Influenza A, POC Negative Negative   Influenza B, POC Negative Negative  POCT rapid strep A  Result Value Ref Range   Rapid Strep A Screen Negative Negative    Assessment & Plan     Influenza and rapid strep collected which resulted negative. Covid-19 test pending. Discussed with patient symptoms ongoing <10 day so likely viral. Will start steroid taper to help improve symptoms including congestion and sinus pressure. If  Covid-19 test negative and/or if symptoms fail to improve or worsen will consider starting antibiotic therapy for bacterial respiratory infection. Patient reports has tolerated Cephalexin in the past. Recommend to continue home supportive care. Follow up as needed.   Return if symptoms worsen or fail to improve.        Mayer Masker, PA-C  [facility] [phone] (phone) [phone] (fax)  [facility]

## 2021-07-08 LAB — NOVEL CORONAVIRUS, NAA: SARS-CoV-2, NAA: NOT DETECTED

## 2021-07-08 LAB — SARS-COV-2, NAA 2 DAY TAT

## 2021-07-09 ENCOUNTER — Telehealth: Payer: Self-pay | Admitting: Nurse Practitioner

## 2021-07-09 ENCOUNTER — Other Ambulatory Visit: Payer: Self-pay

## 2021-07-09 DIAGNOSIS — J029 Acute pharyngitis, unspecified: Secondary | ICD-10-CM

## 2021-07-09 DIAGNOSIS — J069 Acute upper respiratory infection, unspecified: Secondary | ICD-10-CM

## 2021-07-09 DIAGNOSIS — R051 Acute cough: Secondary | ICD-10-CM

## 2021-07-09 MED ORDER — CEPHALEXIN 500 MG PO CAPS: 500.0000 mg | ORAL_CAPSULE | Freq: Two times a day (BID) | ORAL | 0 refills | Status: DC

## 2021-07-09 NOTE — Telephone Encounter (Signed)
Called the patient to notify antibiotic and work note were sent.

## 2021-07-09 NOTE — Telephone Encounter (Signed)
Patient called and said she thinks she needs the antibiotic that was discussed and if she could please get a work note for today. Please advise. [phone]

## 2021-07-12 ENCOUNTER — Other Ambulatory Visit: Payer: Self-pay | Admitting: Nurse Practitioner

## 2021-07-12 DIAGNOSIS — K580 Irritable bowel syndrome with diarrhea: Secondary | ICD-10-CM

## 2021-07-15 ENCOUNTER — Telehealth: Payer: Self-pay | Admitting: Nurse Practitioner

## 2021-07-15 ENCOUNTER — Other Ambulatory Visit: Payer: Self-pay

## 2021-07-15 DIAGNOSIS — J069 Acute upper respiratory infection, unspecified: Secondary | ICD-10-CM

## 2021-07-15 DIAGNOSIS — R051 Acute cough: Secondary | ICD-10-CM

## 2021-07-15 DIAGNOSIS — J029 Acute pharyngitis, unspecified: Secondary | ICD-10-CM

## 2021-07-15 MED ORDER — CEPHALEXIN 500 MG PO CAPS: 500.0000 mg | ORAL_CAPSULE | Freq: Two times a day (BID) | ORAL | 0 refills | Status: DC

## 2021-07-15 NOTE — Telephone Encounter (Signed)
The keflex that was sent in went to [facility] and she needs it sent to [facility] in [place]. Please advise. [phone]

## 2021-07-15 NOTE — Telephone Encounter (Signed)
Patient is aware 

## 2021-07-15 NOTE — Telephone Encounter (Signed)
Refill sent to the [facility] in [place]. Please let the patient know.

## 2021-07-20 ENCOUNTER — Other Ambulatory Visit: Payer: Self-pay | Admitting: Nurse Practitioner

## 2021-07-20 DIAGNOSIS — F411 Generalized anxiety disorder: Secondary | ICD-10-CM

## 2021-07-20 DIAGNOSIS — G43709 Chronic migraine without aura, not intractable, without status migrainosus: Secondary | ICD-10-CM

## 2021-07-29 ENCOUNTER — Other Ambulatory Visit: Payer: Self-pay

## 2021-07-29 ENCOUNTER — Ambulatory Visit (INDEPENDENT_AMBULATORY_CARE_PROVIDER_SITE_OTHER): Payer: 59 | Admitting: Nurse Practitioner

## 2021-07-29 ENCOUNTER — Encounter: Payer: Self-pay | Admitting: Nurse Practitioner

## 2021-07-29 DIAGNOSIS — K580 Irritable bowel syndrome with diarrhea: Secondary | ICD-10-CM | POA: Diagnosis not present

## 2021-07-29 DIAGNOSIS — F5105 Insomnia due to other mental disorder: Secondary | ICD-10-CM | POA: Diagnosis not present

## 2021-07-29 DIAGNOSIS — F411 Generalized anxiety disorder: Secondary | ICD-10-CM

## 2021-07-29 DIAGNOSIS — G43709 Chronic migraine without aura, not intractable, without status migrainosus: Secondary | ICD-10-CM | POA: Diagnosis not present

## 2021-07-29 DIAGNOSIS — F409 Phobic anxiety disorder, unspecified: Secondary | ICD-10-CM

## 2021-07-29 MED ORDER — HYDROXYZINE HCL 50 MG PO TABS: 50.0000 mg | ORAL_TABLET | Freq: Every day | ORAL | 3 refills | Status: DC

## 2021-07-29 MED ORDER — DICYCLOMINE HCL 10 MG PO CAPS: ORAL_CAPSULE | ORAL | 3 refills | Status: DC

## 2021-07-29 MED ORDER — SERTRALINE HCL 100 MG PO TABS: 150.0000 mg | ORAL_TABLET | Freq: Every day | ORAL | 3 refills | Status: DC

## 2021-07-29 MED ORDER — ELETRIPTAN HYDROBROMIDE 40 MG PO TABS: 40.0000 mg | ORAL_TABLET | ORAL | 5 refills | Status: DC | PRN

## 2021-07-29 MED ORDER — BUSPIRONE HCL 15 MG PO TABS: 15.0000 mg | ORAL_TABLET | Freq: Two times a day (BID) | ORAL | 3 refills | Status: DC | PRN

## 2021-07-29 MED ORDER — PROPRANOLOL HCL ER 60 MG PO CP24: 60.0000 mg | ORAL_CAPSULE | Freq: Every day | ORAL | 3 refills | Status: DC

## 2021-07-29 NOTE — Progress Notes (Signed)
Virtual Visit via Telephone Note  I connected with Angela Shepard on 07/29/21 at  2:10 PM EST by telephone and verified that I am speaking with the correct person using two identifiers.  Location: Patient: home Provider: [facility]     I discussed the limitations, risks, security and privacy concerns of performing an evaluation and management service by telephone and the availability of in person appointments. I also discussed with the patient that there may be a patient responsible charge related to this service. The patient expressed understanding and agreed to proceed.   History of Present Illness: The patient presents for routine follow up. States that she has been doing well. The frequency and severity of migraine headaches has improved on current dose propranolol. States that anxiety/depression improved with increased dose sertraline. Taking hydroxyzine at night to help with sleep. She takes buspirone twice daily as needed for acute anxiety. She has no new concerns or complaints today.   Observations/Objective:  The patient is alert and oriented. She is pleasant and answers all questions appropriately. Breathing is non-labored. She is in no acute distress at this time.    Today's Vitals   07/29/21 1417  Weight: 152 lb (68.9 kg)  Height: 5\' 4"  (1.626 m)   Body mass index is 26.09 kg/m.   Assessment and Plan: 1. Chronic migraine without aura without status migrainosus, not intractable Stable. Continue propranolol daily to prevent migraines. May use relpax as needed for acute migraine treatment. Refills provided  - propranolol ER (INDERAL LA) 60 MG 24 hr capsule; Take 1 capsule (60 mg total) by mouth daily.  Dispense: 30 capsule; Refill: 3 - eletriptan (RELPAX) 40 MG tablet; Take 1 tablet (40 mg total) by mouth as needed for migraine or headache. May repeat in 2 hours if headache persists or recurs.  Dispense: 10 tablet; Refill: 5  2. Irritable bowel  syndrome with diarrhea Take dicyclomine as needed up to four times daily if needed for IBS. Refills provided  - dicyclomine (BENTYL) 10 MG capsule; TAKE ONE CAPSULE BY MOUTH FOUR TIMES A DAY BEFORE MEALS AND AT BEDTIME  Dispense: 120 capsule; Refill: 3  3. Generalized anxiety disorder Improved. Contine sertraline 150mg  daily. Take buspirone twice daily as needed for acute anxiety. Refills provided  - sertraline (ZOLOFT) 100 MG tablet; Take 1.5 tablets (150 mg total) by mouth at bedtime.  Dispense: 45 tablet; Refill: 3 - busPIRone (BUSPAR) 15 MG tablet; Take 1 tablet (15 mg total) by mouth 2 (two) times daily as needed.  Dispense: 60 tablet; Refill: 3  4. Insomnia due to anxiety and fear Stable. Continue hydroxyzine 50mg  at bedtime as needed. Refills provided  - hydrOXYzine (ATARAX) 50 MG tablet; Take 1 tablet (50 mg total) by mouth at bedtime.  Dispense: 30 tablet; Refill: 3   Follow Up Instructions:    I discussed the assessment and treatment plan with the patient. The patient was provided an opportunity to ask questions and all were answered. The patient agreed with the plan and demonstrated an understanding of the instructions.   The patient was advised to call back or seek an in-person evaluation if the symptoms worsen or if the condition fails to improve as anticipated.  I provided 10 minutes of non-face-to-face time during this encounter.   , NP   Return in about 4 months (around 11/26/2021) for health maintenance exam, with pap.

## 2021-12-25 ENCOUNTER — Other Ambulatory Visit: Payer: Self-pay | Admitting: Nurse Practitioner

## 2021-12-25 DIAGNOSIS — F411 Generalized anxiety disorder: Secondary | ICD-10-CM

## 2022-01-11 ENCOUNTER — Other Ambulatory Visit: Payer: Self-pay | Admitting: Nurse Practitioner

## 2022-01-11 DIAGNOSIS — F5105 Insomnia due to other mental disorder: Secondary | ICD-10-CM

## 2022-02-01 ENCOUNTER — Telehealth: Payer: 59 | Admitting: Nurse Practitioner

## 2022-02-01 ENCOUNTER — Telehealth: Payer: 59 | Admitting: Physician Assistant

## 2022-02-01 DIAGNOSIS — J40 Bronchitis, not specified as acute or chronic: Secondary | ICD-10-CM | POA: Diagnosis not present

## 2022-02-01 DIAGNOSIS — B9789 Other viral agents as the cause of diseases classified elsewhere: Secondary | ICD-10-CM

## 2022-02-01 DIAGNOSIS — J0110 Acute frontal sinusitis, unspecified: Secondary | ICD-10-CM | POA: Diagnosis not present

## 2022-02-01 MED ORDER — DOXYCYCLINE HYCLATE 100 MG PO TABS: 100.0000 mg | ORAL_TABLET | Freq: Two times a day (BID) | ORAL | 0 refills | Status: AC

## 2022-02-01 NOTE — Progress Notes (Signed)
Seen via video

## 2022-02-01 NOTE — Progress Notes (Signed)
Virtual Visit Consent   Angela Shepard, you are scheduled for a virtual visit with a [facility] provider today. Just as with appointments in the office, your consent must be obtained to participate. Your consent will be active for this visit and any virtual visit you may have with one of our providers in the next 365 days. If you have a MyChart account, a copy of this consent can be sent to you electronically.  As this is a virtual visit, video technology does not allow for your provider to perform a traditional examination. This may limit your provider's ability to fully assess your condition. If your provider identifies any concerns that need to be evaluated in person or the need to arrange testing (such as labs, EKG, etc.), we will make arrangements to do so. Although advances in technology are sophisticated, we cannot ensure that it will always work on either your end or our end. If the connection with a video visit is poor, the visit may have to be switched to a telephone visit. With either a video or telephone visit, we are not always able to ensure that we have a secure connection.  By engaging in this virtual visit, you consent to the provision of healthcare and authorize for your insurance to be billed (if applicable) for the services provided during this visit. Depending on your insurance coverage, you may receive a charge related to this service.  I need to obtain your verbal consent now. Are you willing to proceed with your visit today? [name] has provided verbal consent on 02/01/2022 for a virtual visit (video or telephone). Angela Simas, FNP  Date: 02/01/2022 10:42 AM  Virtual Visit via Video Note   I, Angela Shepard, connected with  Angela RIDINGER  ([id number], 01/15/1996) on 02/01/22 at 11:15 AM EDT by a video-enabled telemedicine application and verified that I am speaking with the correct person using two identifiers.  Location: Patient: Virtual Visit Location Patient:  Home Provider: Virtual Visit Location Provider: Home Office   I discussed the limitations of evaluation and management by telemedicine and the availability of in person appointments. The patient expressed understanding and agreed to proceed.    History of Present Illness: Angela Shepard is a 26 y.o. who identifies as a female who was assigned female at birth, and is being seen today with complaints of cough, sinus congestion, post nasal drainage. Last night she was wheezing when she was trying to sleep and coughing constantly. Today she feels the symptoms are worse in her head/sinuses. She feels very congested without nasal drainage.   She has been getting sinus headaches as well.   She denies a fever.   She denies a history of asthma, she does have an inhaler at home from the last time she was sick in February of this year.   She has been using Flonase and her daily allergy medication.   She has been around coworkers with similar symptoms and everyone has tested negative for COVID so far   Problems:  Patient Active Problem List   Diagnosis Date Noted   Chronic migraine without aura without status migrainosus, not intractable 05/16/2021   Irritable bowel syndrome with diarrhea 05/16/2021   Generalized anxiety disorder 05/16/2021   Insomnia due to anxiety and fear 05/16/2021   Body mass index 26.0-26.9, adult 05/16/2021   Left ureteral stone 12/10/2018   Chronic migraine 11/08/2018    Allergies:  Allergies  Allergen Reactions   Amoxicillin  Penicillins Rash    Did it involve swelling of the face/tongue/throat, SOB, or low BP? Unknown Did it involve sudden or severe rash/hives, skin peeling, or any reaction on the inside of your mouth or nose? Unknown Did you need to seek medical attention at a hospital or doctor's office? Unknown When did it last happen?    Childhood   If all above answers are "NO", may proceed with cephalosporin use.   Medications:  Current Outpatient  Medications:    busPIRone (BUSPAR) 15 MG tablet, Take 1 tablet (15 mg total) by mouth 2 (two) times daily as needed., Disp: 60 tablet, Rfl: 3   cephALEXin (KEFLEX) 500 MG capsule, Take 1 capsule (500 mg total) by mouth 2 (two) times daily., Disp: 10 capsule, Rfl: 0   dicyclomine (BENTYL) 10 MG capsule, TAKE ONE CAPSULE BY MOUTH FOUR TIMES A DAY BEFORE MEALS AND AT BEDTIME, Disp: 120 capsule, Rfl: 3   eletriptan (RELPAX) 40 MG tablet, Take 1 tablet (40 mg total) by mouth as needed for migraine or headache. May repeat in 2 hours if headache persists or recurs., Disp: 10 tablet, Rfl: 5   hydrOXYzine (ATARAX) 50 MG tablet, TAKE ONE TABLET BY MOUTH AT BEDTIME, Disp: 30 tablet, Rfl: 3   methylPREDNISolone (MEDROL DOSEPAK) 4 MG TBPK tablet, Take as directed on package. (Patient not taking: Reported on 07/29/2021), Disp: 21 tablet, Rfl: 0   propranolol ER (INDERAL LA) 60 MG 24 hr capsule, Take 1 capsule (60 mg total) by mouth daily., Disp: 30 capsule, Rfl: 3   sertraline (ZOLOFT) 100 MG tablet, TAKE 1 AND 1/2 TABLET BY MOUTH EVERY NIGHT AT BEDTIME, Disp: 45 tablet, Rfl: 3  Observations/Objective: Patient is well-developed, well-nourished in no acute distress.  Resting comfortably  at home.  Head is normocephalic, atraumatic.  No labored breathing.  Speech is clear and coherent with logical content.  Patient is alert and oriented at baseline.    Assessment and Plan: 1. Acute non-recurrent frontal sinusitis Continue Flonase and allergy medication as directed  - doxycycline (VIBRA-TABS) 100 MG tablet; Take 1 tablet (100 mg total) by mouth 2 (two) times daily for 7 days.  Dispense: 14 tablet; Refill: 0  2. Bronchitis Continue Albuterol as directed  Mucinex OTC per package directions  - doxycycline (VIBRA-TABS) 100 MG tablet; Take 1 tablet (100 mg total) by mouth 2 (two) times daily for 7 days.  Dispense: 14 tablet; Refill: 0     Follow Up Instructions: I discussed the assessment and treatment plan  with the patient. The patient was provided an opportunity to ask questions and all were answered. The patient agreed with the plan and demonstrated an understanding of the instructions.  A copy of instructions were sent to the patient via MyChart unless otherwise noted below.    The patient was advised to call back or seek an in-person evaluation if the symptoms worsen or if the condition fails to improve as anticipated.  Time:  I spent 10 minutes with the patient via telehealth technology discussing the above problems/concerns.    Angela Simas, FNP

## 2022-03-08 ENCOUNTER — Other Ambulatory Visit: Payer: Self-pay | Admitting: Nurse Practitioner

## 2022-03-08 DIAGNOSIS — G43709 Chronic migraine without aura, not intractable, without status migrainosus: Secondary | ICD-10-CM

## 2022-03-08 DIAGNOSIS — F411 Generalized anxiety disorder: Secondary | ICD-10-CM

## 2022-06-01 ENCOUNTER — Telehealth: Payer: Self-pay | Admitting: *Deleted

## 2022-06-01 NOTE — Telephone Encounter (Signed)
Pt called needing to cancel her appointment tomorrow due to work will call to reschedule.Angela Shepard, CMA

## 2022-06-02 ENCOUNTER — Ambulatory Visit: Payer: Self-pay | Admitting: Nurse Practitioner

## 2022-07-25 ENCOUNTER — Telehealth: Payer: 59 | Admitting: Emergency Medicine

## 2022-07-25 ENCOUNTER — Telehealth: Payer: Self-pay | Admitting: Physician Assistant

## 2022-07-25 DIAGNOSIS — R69 Illness, unspecified: Secondary | ICD-10-CM | POA: Diagnosis not present

## 2022-07-25 DIAGNOSIS — F419 Anxiety disorder, unspecified: Secondary | ICD-10-CM

## 2022-07-25 DIAGNOSIS — R112 Nausea with vomiting, unspecified: Secondary | ICD-10-CM | POA: Diagnosis not present

## 2022-07-25 DIAGNOSIS — F4321 Adjustment disorder with depressed mood: Secondary | ICD-10-CM | POA: Diagnosis not present

## 2022-07-25 MED ORDER — ONDANSETRON HCL 4 MG PO TABS: 4.0000 mg | ORAL_TABLET | Freq: Three times a day (TID) | ORAL | 0 refills | Status: DC | PRN

## 2022-07-25 NOTE — Progress Notes (Signed)
Thank you for the details you included in the comment boxes and we are sorry about the loss of your family animal. Those details are very helpful in determining the best course of treatment for you and help [place] to provide the best care. Because of the medical request, we recommend that you convert this visit to a video visit in order for the provider to better assess what is going on.  The provider will be able to give you a more accurate diagnosis and treatment plan if we can more freely discuss your symptoms and with the addition of a virtual examination.   If you convert to a video visit, we will bill your insurance (similar to an office visit) and you will not be charged for this e-Visit. You will be able to stay at home and speak with the first available [facility] advanced practice provider. The link to do a video visit is in the drop down Menu tab of your Welcome screen in [place].   I have spent 5 minutes in review of e-visit questionnaire, review and updating patient chart, medical decision making and response to patient.   Mar Daring, PA-C

## 2022-07-25 NOTE — Progress Notes (Signed)
Virtual Visit Consent   Angela Shepard, you are scheduled for a virtual visit with a [facility] provider today. Just as with appointments in the office, your consent must be obtained to participate. Your consent will be active for this visit and any virtual visit you may have with one of our providers in the next 365 days. If you have a MyChart account, a copy of this consent can be sent to you electronically.  As this is a virtual visit, video technology does not allow for your provider to perform a traditional examination. This may limit your provider's ability to fully assess your condition. If your provider identifies any concerns that need to be evaluated in person or the need to arrange testing (such as labs, EKG, etc.), we will make arrangements to do so. Although advances in technology are sophisticated, we cannot ensure that it will always work on either your end or our end. If the connection with a video visit is poor, the visit may have to be switched to a telephone visit. With either a video or telephone visit, we are not always able to ensure that we have a secure connection.  By engaging in this virtual visit, you consent to the provision of healthcare and authorize for your insurance to be billed (if applicable) for the services provided during this visit. Depending on your insurance coverage, you may receive a charge related to this service.  I need to obtain your verbal consent now. Are you willing to proceed with your visit today? Angela Shepard has provided verbal consent on 07/25/2022 for a virtual visit (video or telephone). Carvel Getting, NP  Date: 07/25/2022 9:31 AM  Virtual Visit via Video Note   I, Carvel Getting, connected with  Angela Shepard  ([id number], 03-Sep-1995) on 07/25/22 at  9:30 AM EST by a video-enabled telemedicine application and verified that I am speaking with the correct person using two identifiers.  Location: Patient: Virtual Visit Location Patient:  Home Provider: Virtual Visit Location Provider: Home Office   I discussed the limitations of evaluation and management by telemedicine and the availability of in person appointments. The patient expressed understanding and agreed to proceed.    History of Present Illness: Angela Shepard is a 27 y.o. who identifies as a female who was assigned female at birth, and is being seen today for nausea and vomiting.  Patient had her cat euthanized 4 days ago.  She has a history of generalized anxiety disorder for which she takes Zoloft, BuSpar, hydroxyzine.  She is taking her medications as prescribed.  Since having her cat euthanized, she has had trouble with nausea primarily, only occasionally vomiting.  She reports that this reaction is not uncommon for her when her anxiety is exacerbated.  She feels she is hydrated as she has been able to drink copious amounts of fluids.  What she is having trouble with is eating.  She request medicine for nausea.  HPI: HPI  Problems:  Patient Active Problem List   Diagnosis Date Noted   Chronic migraine without aura without status migrainosus, not intractable 05/16/2021   Irritable bowel syndrome with diarrhea 05/16/2021   Generalized anxiety disorder 05/16/2021   Insomnia due to anxiety and fear 05/16/2021   Body mass index 26.0-26.9, adult 05/16/2021   Left ureteral stone 12/10/2018   Chronic migraine 11/08/2018    Allergies:  Allergies  Allergen Reactions   Amoxicillin    Penicillins Rash    Did it involve  swelling of the face/tongue/throat, SOB, or low BP? Unknown Did it involve sudden or severe rash/hives, skin peeling, or any reaction on the inside of your mouth or nose? Unknown Did you need to seek medical attention at a hospital or doctor's office? Unknown When did it last happen?    Childhood   If all above answers are "NO", may proceed with cephalosporin use.   Medications:  Current Outpatient Medications:    ondansetron (ZOFRAN) 4 MG tablet,  Take 1 tablet (4 mg total) by mouth every 8 (eight) hours as needed for nausea or vomiting., Disp: 20 tablet, Rfl: 0   busPIRone (BUSPAR) 15 MG tablet, TAKE ONE TABLET BY MOUTH TWICE A DAY AS NEEDED, Disp: 60 tablet, Rfl: 3   cephALEXin (KEFLEX) 500 MG capsule, Take 1 capsule (500 mg total) by mouth 2 (two) times daily., Disp: 10 capsule, Rfl: 0   dicyclomine (BENTYL) 10 MG capsule, TAKE ONE CAPSULE BY MOUTH FOUR TIMES A DAY BEFORE MEALS AND AT BEDTIME, Disp: 120 capsule, Rfl: 3   eletriptan (RELPAX) 40 MG tablet, Take 1 tablet (40 mg total) by mouth as needed for migraine or headache. May repeat in 2 hours if headache persists or recurs., Disp: 10 tablet, Rfl: 5   hydrOXYzine (ATARAX) 50 MG tablet, TAKE ONE TABLET BY MOUTH AT BEDTIME, Disp: 30 tablet, Rfl: 3   methylPREDNISolone (MEDROL DOSEPAK) 4 MG TBPK tablet, Take as directed on package. (Patient not taking: Reported on 07/29/2021), Disp: 21 tablet, Rfl: 0   propranolol ER (INDERAL LA) 60 MG 24 hr capsule, TAKE ONE CAPSULE BY MOUTH DAILY, Disp: 30 capsule, Rfl: 3   sertraline (ZOLOFT) 100 MG tablet, TAKE 1 AND 1/2 TABLET BY MOUTH EVERY NIGHT AT BEDTIME, Disp: 45 tablet, Rfl: 3  Observations/Objective: Patient is well-developed, well-nourished in no acute distress.  Resting comfortably  at home.  Head is normocephalic, atraumatic.  No labored breathing.  Speech is clear and coherent with logical content.  Patient is alert and oriented at baseline.    Assessment and Plan: 1. Nausea and vomiting, unspecified vomiting type  2. Grief reaction  I prescribed Zofran.  Patient will continue taking her anxiety medications as prescribed.  We discussed grief and its waxing and waning of symptoms.  Patient to follow-up with her PCP who manages her anxiety medications if she needs adjustments.  Follow Up Instructions: I discussed the assessment and treatment plan with the patient. The patient was provided an opportunity to ask questions and all were  answered. The patient agreed with the plan and demonstrated an understanding of the instructions.  A copy of instructions were sent to the patient via MyChart unless otherwise noted below.    The patient was advised to call back or seek an in-person evaluation if the symptoms worsen or if the condition fails to improve as anticipated.  Time:  I spent 12 minutes with the patient via telehealth technology discussing the above problems/concerns.    Carvel Getting, NP

## 2022-08-06 ENCOUNTER — Other Ambulatory Visit: Payer: Self-pay | Admitting: Nurse Practitioner

## 2022-08-06 DIAGNOSIS — F411 Generalized anxiety disorder: Secondary | ICD-10-CM

## 2022-09-15 ENCOUNTER — Other Ambulatory Visit: Payer: Self-pay | Admitting: Nurse Practitioner

## 2022-09-15 DIAGNOSIS — F5105 Insomnia due to other mental disorder: Secondary | ICD-10-CM

## 2022-09-22 ENCOUNTER — Ambulatory Visit (INDEPENDENT_AMBULATORY_CARE_PROVIDER_SITE_OTHER): Payer: 59 | Admitting: Nurse Practitioner

## 2022-09-22 ENCOUNTER — Encounter: Payer: Self-pay | Admitting: Nurse Practitioner

## 2022-09-22 ENCOUNTER — Other Ambulatory Visit (HOSPITAL_COMMUNITY)
Admission: RE | Admit: 2022-09-22 | Discharge: 2022-09-22 | Disposition: A | Discharge status: Home / Self Care | Payer: 59 | Source: Ambulatory Visit | Attending: Nurse Practitioner | Admitting: Nurse Practitioner

## 2022-09-22 VITALS — BP 93/62 | HR 79 | Ht 64.0 in | Wt 144.4 lb

## 2022-09-22 DIAGNOSIS — R109 Unspecified abdominal pain: Secondary | ICD-10-CM

## 2022-09-22 DIAGNOSIS — F411 Generalized anxiety disorder: Secondary | ICD-10-CM

## 2022-09-22 DIAGNOSIS — Z6826 Body mass index (BMI) 26.0-26.9, adult: Secondary | ICD-10-CM

## 2022-09-22 DIAGNOSIS — R5383 Other fatigue: Secondary | ICD-10-CM

## 2022-09-22 DIAGNOSIS — R7301 Impaired fasting glucose: Secondary | ICD-10-CM | POA: Diagnosis not present

## 2022-09-22 DIAGNOSIS — E559 Vitamin D deficiency, unspecified: Secondary | ICD-10-CM | POA: Diagnosis not present

## 2022-09-22 DIAGNOSIS — G43709 Chronic migraine without aura, not intractable, without status migrainosus: Secondary | ICD-10-CM | POA: Diagnosis not present

## 2022-09-22 DIAGNOSIS — F5105 Insomnia due to other mental disorder: Secondary | ICD-10-CM

## 2022-09-22 DIAGNOSIS — Z01419 Encounter for gynecological examination (general) (routine) without abnormal findings: Secondary | ICD-10-CM | POA: Diagnosis not present

## 2022-09-22 DIAGNOSIS — E785 Hyperlipidemia, unspecified: Secondary | ICD-10-CM

## 2022-09-22 DIAGNOSIS — Z0001 Encounter for general adult medical examination with abnormal findings: Secondary | ICD-10-CM

## 2022-09-22 DIAGNOSIS — F409 Phobic anxiety disorder, unspecified: Secondary | ICD-10-CM

## 2022-09-22 HISTORY — DX: Hyperlipidemia, unspecified: E78.5

## 2022-09-22 HISTORY — DX: Other fatigue: R53.83

## 2022-09-22 MED ORDER — PROPRANOLOL HCL ER 80 MG PO CP24
80.0000 mg | ORAL_CAPSULE | Freq: Every day | ORAL | 3 refills | Status: DC
Start: 2022-09-22 — End: 2023-01-04

## 2022-09-22 MED ORDER — HYOSCYAMINE SULFATE 0.125 MG PO TBDP
0.1250 mg | ORAL_TABLET | Freq: Two times a day (BID) | ORAL | 2 refills | Status: DC | PRN
Start: 2022-09-22 — End: 2023-02-22

## 2022-09-22 MED ORDER — SERTRALINE HCL 100 MG PO TABS
ORAL_TABLET | ORAL | 3 refills | Status: DC
Start: 2022-09-22 — End: 2023-01-04

## 2022-09-22 MED ORDER — HYDROXYZINE HCL 50 MG PO TABS
50.0000 mg | ORAL_TABLET | Freq: Two times a day (BID) | ORAL | 3 refills | Status: DC | PRN
Start: 2022-09-22 — End: 2023-01-04

## 2022-09-22 MED ORDER — BUSPIRONE HCL 30 MG PO TABS
30.0000 mg | ORAL_TABLET | Freq: Two times a day (BID) | ORAL | 3 refills | Status: DC | PRN
Start: 2022-09-22 — End: 2023-01-04

## 2022-09-22 NOTE — Progress Notes (Signed)
.Complete physical exam   Patient: Angela Shepard   DOB: 07-17-1995   26 y.o. Female  MRN: [id number] Visit Date: 09/22/2022    Chief Complaint  Patient presents with   Annual Exam   Gynecologic Exam   Subjective    Angela Shepard is a 27 y.o. female who presents today for a complete physical exam.  She reports consuming a  generally healthy  diet. The patient has a physically strenuous job, but has no regular exercise apart from work.  She generally feels well. She does have additional problems to discuss today.   HPI  Annual physical with pap smear.  -having irritable bowel with diarrhea.  --can directly correlate this with feelings of anxiety.  --dicyclomine not really helping. Makes her feel nauseated and does not stop loose bowel movements . -Increased anxiety.  -migraine headaches  -=having 1 to 3 headaches per week.  --are less severe than prior to starting propranolol  -needs to incorporate exercise into her daily routine.  --due for routine,fasting labs   Past Medical History:  Diagnosis Date   ADHD    Past Surgical History:  Procedure Laterality Date   CYSTOSCOPY WITH RETROGRADE PYELOGRAM, URETEROSCOPY AND STENT PLACEMENT Left 12/10/2018   Procedure: CYSTOSCOPY WITH RETROGRADE PYELOGRAM, URETEROSCOPY AND STENT PLACEMENT basket extraction;  Surgeon: Heloise Purpura, MD;  Location: [facility];  Service: Urology;  Laterality: Left;   Social History   Socioeconomic History   Marital status: Single    Spouse name: Not on file   Number of children: Not on file   Years of education: Not on file   Highest education level: Not on file  Occupational History   Not on file  Tobacco Use   Smoking status: Never   Smokeless tobacco: Never  Vaping Use   Vaping Use: Every day  Substance and Sexual Activity   Alcohol use: Never   Drug use: Never   Sexual activity: Yes    Partners: Male  Other Topics Concern   Not on file  Social History Narrative   6 serving of  caffeine daily    Social Determinants of Health   Financial Resource Strain: Not on file  Food Insecurity: Not on file  Transportation Needs: Not on file  Physical Activity: Not on file  Stress: Not on file  Social Connections: Not on file  Intimate Partner Violence: Not on file   Family Status  Relation Name Status   Mother  Alive   Father  Alive   Brother  Alive   Family History  Problem Relation Age of Onset   ADD / ADHD Father    Arthritis Father    Autism Brother    Allergies  Allergen Reactions   Amoxicillin    Penicillins Rash    Did it involve swelling of the face/tongue/throat, SOB, or low BP? Unknown Did it involve sudden or severe rash/hives, skin peeling, or any reaction on the inside of your mouth or nose? Unknown Did you need to seek medical attention at a hospital or doctor's office? Unknown When did it last happen?    Childhood   If all above answers are "NO", may proceed with cephalosporin use.    Patient Care Team: Carlean Jews, NP as PCP - General (Family Medicine)   Medications: Outpatient Medications Prior to Visit  Medication Sig   [DISCONTINUED] busPIRone (BUSPAR) 15 MG tablet TAKE ONE TABLET BY MOUTH TWICE A DAY AS NEEDED   [DISCONTINUED] dicyclomine (BENTYL) 10 MG capsule  TAKE ONE CAPSULE BY MOUTH FOUR TIMES A DAY BEFORE MEALS AND AT BEDTIME   [DISCONTINUED] hydrOXYzine (ATARAX) 50 MG tablet TAKE 1 TABLET BY MOUTH AT BEDTIME   [DISCONTINUED] propranolol ER (INDERAL LA) 60 MG 24 hr capsule TAKE ONE CAPSULE BY MOUTH DAILY   [DISCONTINUED] sertraline (ZOLOFT) 100 MG tablet TAKE 1 AND 1/2 TABLET BY MOUTH EVERY NIGHT AT BEDTIME   methylPREDNISolone (MEDROL DOSEPAK) 4 MG TBPK tablet Take as directed on package. (Patient not taking: Reported on 07/29/2021)   [DISCONTINUED] cephALEXin (KEFLEX) 500 MG capsule Take 1 capsule (500 mg total) by mouth 2 (two) times daily.   [DISCONTINUED] eletriptan (RELPAX) 40 MG tablet Take 1 tablet (40 mg total) by  mouth as needed for migraine or headache. May repeat in 2 hours if headache persists or recurs.   [DISCONTINUED] ondansetron (ZOFRAN) 4 MG tablet Take 1 tablet (4 mg total) by mouth every 8 (eight) hours as needed for nausea or vomiting.   No facility-administered medications prior to visit.    Review of Systems  See HPI     Objective     Today's Vitals   09/22/22 0900  BP: 93/62  Pulse: 79  SpO2: 97%  Weight: 144 lb 6.4 oz (65.5 kg)  Height: 5\' 4"  (1.626 m)   Body mass index is 24.79 kg/m.  BP Readings from Last 3 Encounters:  09/22/22 93/62  07/07/21 106/71  05/12/21 97/63    Wt Readings from Last 3 Encounters:  09/22/22 144 lb 6.4 oz (65.5 kg)  07/29/21 152 lb (68.9 kg)  07/07/21 152 lb (68.9 kg)     Physical Exam Vitals and nursing note reviewed. Exam conducted with a chaperone present.  Constitutional:      Appearance: Normal appearance. She is well-developed.  HENT:     Head: Normocephalic and atraumatic.     Right Ear: Tympanic membrane, ear canal and external ear normal.     Left Ear: Tympanic membrane, ear canal and external ear normal.     Nose: Nose normal.     Mouth/Throat:     Mouth: Mucous membranes are moist.     Pharynx: Oropharynx is clear.  Eyes:     Extraocular Movements: Extraocular movements intact.     Conjunctiva/sclera: Conjunctivae normal.     Pupils: Pupils are equal, round, and reactive to light.  Neck:     Vascular: No carotid bruit.  Cardiovascular:     Rate and Rhythm: Normal rate and regular rhythm.     Pulses: Normal pulses.     Heart sounds: Normal heart sounds.  Pulmonary:     Effort: Pulmonary effort is normal.     Breath sounds: Normal breath sounds.  Chest:  Breasts:    Right: Normal. No swelling, bleeding, inverted nipple, mass, nipple discharge, skin change or tenderness.     Left: Normal. No swelling, bleeding, inverted nipple, mass, nipple discharge, skin change or tenderness.  Abdominal:     General: Bowel  sounds are normal. There is no distension.     Palpations: Abdomen is soft. There is no mass.     Tenderness: There is no abdominal tenderness. There is no right CVA tenderness, left CVA tenderness, guarding or rebound.     Hernia: A hernia is present. There is no hernia in the left inguinal area or right inguinal area.  Genitourinary:    General: Normal vulva.     Exam position: Supine.     Labia:        Right:  No rash, tenderness, lesion or injury.        Left: No rash, tenderness, lesion or injury.      Vagina: No signs of injury and foreign body. No vaginal discharge, erythema, tenderness, bleeding, lesions or prolapsed vaginal walls.     Cervix: No cervical motion tenderness, discharge, friability, lesion, erythema, cervical bleeding or eversion.     Uterus: Not deviated, not enlarged, not fixed, not tender and no uterine prolapse.      Adnexa: Right adnexa normal and left adnexa normal.     Comments: No tenderness, masses, or organomeglay present during bimanual exam .   Musculoskeletal:        General: Normal range of motion.     Cervical back: Normal range of motion and neck supple.  Lymphadenopathy:     Cervical: No cervical adenopathy.     Upper Body:     Right upper body: No axillary adenopathy.     Left upper body: No axillary adenopathy.     Lower Body: No right inguinal adenopathy. No left inguinal adenopathy.  Skin:    General: Skin is warm and dry.     Capillary Refill: Capillary refill takes less than 2 seconds.  Neurological:     General: No focal deficit present.     Mental Status: She is alert and oriented to person, place, and time.  Psychiatric:        Mood and Affect: Mood normal.        Behavior: Behavior normal.        Thought Content: Thought content normal.        Judgment: Judgment normal.     Last depression screening scores   Row Labels 09/22/2022    9:54 AM 07/29/2021    2:19 PM 07/07/2021    3:13 PM  PHQ 2/9 Scores   Section Header. No data  exists in this row.     PHQ - 2 Score   2 0 1  PHQ- 9 Score   11 1 5    Last fall risk screening   Row Labels 09/22/2022    9:54 AM  Fall Risk    Section Header. No data exists in this row.   Falls in the past year?   0  Number falls in past yr:   0  Injury with Fall?   0  Follow up   Falls evaluation completed     Assessment & Plan     Well woman exam -     Cytology - PAP  Chronic migraine without aura without status migrainosus, not intractable Assessment & Plan: Improved and stable.  -continue propranolol to reduce severity and frequendcy of migraine headaches.  -take relpax as needed and as prescribed for abortive therapy.   Orders: -     Propranolol HCl ER; Take 1 capsule (80 mg total) by mouth daily.  Dispense: 30 capsule; Refill: 3  Impaired fasting glucose Assessment & Plan: Check HgbA1c with routine labs   Orders: -     Hemoglobin A1c; Future  Hyperlipidemia LDL goal <100 Assessment & Plan: Routine, fasting labs drawn during today's visit.   Orders: -     Lipid panel; Future -     Comprehensive metabolic panel; Future -     CBC; Future  Generalized anxiety disorder Assessment & Plan: Take sertraline 200 mg daily.  -may take buspirone 30 mg up to twice daily as needed for acute anxiety.  Continue to monitor.   Orders: -  busPIRone HCl; Take 1 tablet (30 mg total) by mouth 2 (two) times daily as needed.  Dispense: 60 tablet; Refill: 3 -     Sertraline HCl; Take 2 tablets po QHS  Dispense: 60 tablet; Refill: 3  Insomnia due to anxiety and fear Assessment & Plan: Take hydroxyzine at bedtime as needed to reduce insomniha   Orders: -     hydrOXYzine HCl; Take 1 tablet (50 mg total) by mouth 2 (two) times daily as needed.  Dispense: 60 tablet; Refill: 3  Abdominal cramps Assessment & Plan: Trial hyoscyamine to reduce cramping and diarrhea.  Encouraged her to keep food journal to pinpoint triggers.  -does associate cramping and  diarrhea with  increased anxiety.   Orders: -     Hyoscyamine Sulfate; Place 1 tablet (0.125 mg total) under the tongue 2 (two) times daily as needed.  Dispense: 60 tablet; Refill: 2  Other fatigue Assessment & Plan: Check labs for further evaluation  Orders: -     TSH + free T4; Future -     Comprehensive metabolic panel; Future -     CBC; Future  Vitamin D deficiency Assessment & Plan: Check vitamin d level and treat deficiency as indicated.    Orders: -     VITAMIN D 25 Hydroxy (Vit-D Deficiency, Fractures); Future  Body mass index 26.0-26.9, adult Assessment & Plan: Discussed lowering calorie intake to 1500 calories per day and incorporating exercise into daily routine to help lose weight.       Immunization History  Administered Date(s) Administered   Influenza,inj,Quad PF,6+ Mos 05/12/2021   Moderna Sars-Covid-2 Vaccination 03/25/2020, 04/24/2020   Tdap 10/15/2020    Health Maintenance  Topic Date Due   HPV VACCINES (1 - 2-dose series) Never done   Hepatitis C Screening  Never done   COVID-19 Vaccine (3 - 2023-24 season) 02/11/2022   INFLUENZA VACCINE  01/12/2023   PAP-Cervical Cytology Screening  09/21/2025   PAP SMEAR-Modifier  09/21/2025   DTaP/Tdap/Td (2 - Td or Tdap) 10/16/2030   HIV Screening  Completed    Discussed health benefits of physical activity, and encouraged her to engage in regular exercise appropriate for her age and condition.    Return in about 3 months (around 12/22/2022) for mood, migraines.        Carlean Jews, NP  [facility] at [facility] [phone] (phone) [phone] (fax)  [facility]

## 2022-09-23 LAB — CBC
Hematocrit: 37.6 % (ref 34.0–46.6)
Hemoglobin: 12.5 g/dL (ref 11.1–15.9)
MCH: 30.4 pg (ref 26.6–33.0)
MCHC: 33.2 g/dL (ref 31.5–35.7)
MCV: 92 fL (ref 79–97)
Platelets: 284 10*3/uL (ref 150–450)
RBC: 4.11 x10E6/uL (ref 3.77–5.28)
RDW: 12.5 % (ref 11.7–15.4)
WBC: 7.2 10*3/uL (ref 3.4–10.8)

## 2022-09-23 LAB — COMPREHENSIVE METABOLIC PANEL
ALT: 13 IU/L (ref 0–32)
AST: 20 IU/L (ref 0–40)
Albumin/Globulin Ratio: 1.9 (ref 1.2–2.2)
Albumin: 4.4 g/dL (ref 4.0–5.0)
Alkaline Phosphatase: 54 IU/L (ref 44–121)
BUN/Creatinine Ratio: 12 (ref 9–23)
BUN: 9 mg/dL (ref 6–20)
Bilirubin Total: 0.3 mg/dL (ref 0.0–1.2)
CO2: 24 mmol/L (ref 20–29)
Calcium: 8.9 mg/dL (ref 8.7–10.2)
Chloride: 106 mmol/L (ref 96–106)
Creatinine, Ser: 0.75 mg/dL (ref 0.57–1.00)
Globulin, Total: 2.3 g/dL (ref 1.5–4.5)
Glucose: 78 mg/dL (ref 70–99)
Potassium: 4.4 mmol/L (ref 3.5–5.2)
Sodium: 144 mmol/L (ref 134–144)
Total Protein: 6.7 g/dL (ref 6.0–8.5)
eGFR: 113 mL/min/{1.73_m2} (ref >59)

## 2022-09-23 LAB — LIPID PANEL
Chol/HDL Ratio: 2.5 ratio (ref 0.0–4.4)
Cholesterol, Total: 124 mg/dL (ref 100–199)
HDL: 50 mg/dL (ref >39)
LDL Chol Calc (NIH): 59 mg/dL (ref 0–99)
Triglycerides: 72 mg/dL (ref 0–149)
VLDL Cholesterol Cal: 15 mg/dL (ref 5–40)

## 2022-09-23 LAB — HEMOGLOBIN A1C
Est. average glucose Bld gHb Est-mCnc: 97 mg/dL
Hgb A1c MFr Bld: 5 % (ref 4.8–5.6)

## 2022-09-23 LAB — TSH+FREE T4
Free T4: 1.13 ng/dL (ref 0.82–1.77)
TSH: 0.975 u[IU]/mL (ref 0.450–4.500)

## 2022-09-23 LAB — VITAMIN D 25 HYDROXY (VIT D DEFICIENCY, FRACTURES): Vit D, 25-Hydroxy: 17.7 ng/mL — ABNORMAL LOW (ref 30.0–100.0)

## 2022-09-30 LAB — CYTOLOGY - PAP
Comment: NEGATIVE
Diagnosis: UNDETERMINED — AB
High risk HPV: NEGATIVE

## 2022-10-25 DIAGNOSIS — R109 Unspecified abdominal pain: Secondary | ICD-10-CM

## 2022-10-25 HISTORY — DX: Unspecified abdominal pain: R10.9

## 2022-10-25 NOTE — Assessment & Plan Note (Signed)
Discussed lowering calorie intake to 1500 calories per day and incorporating exercise into daily routine to help lose weight.  °

## 2022-10-25 NOTE — Assessment & Plan Note (Signed)
Check HgbA1c with routine labs

## 2022-10-25 NOTE — Assessment & Plan Note (Signed)
Trial hyoscyamine to reduce cramping and diarrhea.  Encouraged her to keep food journal to pinpoint triggers.  -does associate cramping and  diarrhea with increased anxiety.

## 2022-10-25 NOTE — Assessment & Plan Note (Addendum)
Take sertraline 200 mg daily.  -may take buspirone 30 mg up to twice daily as needed for acute anxiety.  Continue to monitor.

## 2022-10-25 NOTE — Assessment & Plan Note (Signed)
Routine, fasting labs drawn during today's visit.  

## 2022-10-25 NOTE — Assessment & Plan Note (Signed)
Improved and stable.  -continue propranolol to reduce severity and frequendcy of migraine headaches.  -take relpax as needed and as prescribed for abortive therapy.

## 2022-10-25 NOTE — Assessment & Plan Note (Signed)
Check vitamin d level and treat deficiency as indicated.   

## 2022-10-25 NOTE — Assessment & Plan Note (Signed)
Take hydroxyzine at bedtime as needed to reduce insomniha

## 2022-10-25 NOTE — Assessment & Plan Note (Signed)
Check labs for further evaluation.  

## 2022-10-27 ENCOUNTER — Telehealth: Payer: 59 | Admitting: Physician Assistant

## 2022-10-27 ENCOUNTER — Telehealth: Payer: 59

## 2022-10-27 DIAGNOSIS — R051 Acute cough: Secondary | ICD-10-CM | POA: Diagnosis not present

## 2022-10-27 DIAGNOSIS — J069 Acute upper respiratory infection, unspecified: Secondary | ICD-10-CM

## 2022-10-27 MED ORDER — PROMETHAZINE-DM 6.25-15 MG/5ML PO SYRP
5.0000 mL | ORAL_SOLUTION | Freq: Four times a day (QID) | ORAL | 0 refills | Status: DC | PRN
Start: 2022-10-27 — End: 2023-01-04

## 2022-10-27 MED ORDER — FLUTICASONE PROPIONATE 50 MCG/ACT NA SUSP: 2.0000 | Freq: Every day | NASAL | 6 refills | Status: DC

## 2022-10-27 NOTE — Patient Instructions (Signed)
[name], thank you for joining Tylene Fantasia Ward, [name]  While this provider is not your primary care provider (PCP), if your PCP is located in our provider database this encounter information will be shared with them immediately following your visit.   A [facility] MyChart account gives you access to today's visit and all your visits, tests, and labs performed at [facility] " click here if you don't have a [facility] MyChart account or go to [email or web address]  Consent: (Patient) HEPHZIBAH STARIHA provided verbal consent for this virtual visit at the beginning of the encounter.  Current Medications:  Current Outpatient Medications:    fluticasone (FLONASE) 50 MCG/ACT nasal spray, Place 2 sprays into both nostrils daily., Disp: 16 g, Rfl: 6   promethazine-dextromethorphan (PROMETHAZINE-DM) 6.25-15 MG/5ML syrup, Take 5 mLs by mouth 4 (four) times daily as needed for cough., Disp: 118 mL, Rfl: 0   busPIRone (BUSPAR) 30 MG tablet, Take 1 tablet (30 mg total) by mouth 2 (two) times daily as needed., Disp: 60 tablet, Rfl: 3   hydrOXYzine (ATARAX) 50 MG tablet, Take 1 tablet (50 mg total) by mouth 2 (two) times daily as needed., Disp: 60 tablet, Rfl: 3   hyoscyamine (ANASPAZ) 0.125 MG TBDP disintergrating tablet, Place 1 tablet (0.125 mg total) under the tongue 2 (two) times daily as needed., Disp: 60 tablet, Rfl: 2   methylPREDNISolone (MEDROL DOSEPAK) 4 MG TBPK tablet, Take as directed on package. (Patient not taking: Reported on 07/29/2021), Disp: 21 tablet, Rfl: 0   propranolol ER (INDERAL LA) 80 MG 24 hr capsule, Take 1 capsule (80 mg total) by mouth daily., Disp: 30 capsule, Rfl: 3   sertraline (ZOLOFT) 100 MG tablet, Take 2 tablets po QHS, Disp: 60 tablet, Rfl: 3   Medications ordered in this encounter:  Meds ordered this encounter  Medications   promethazine-dextromethorphan (PROMETHAZINE-DM) 6.25-15 MG/5ML syrup    Sig: Take 5 mLs by  mouth 4 (four) times daily as needed for cough.    Dispense:  118 mL    Refill:  0    Order Specific Question:   Supervising Provider    Answer:   Merrilee Jansky [[id number]]   fluticasone (FLONASE) 50 MCG/ACT nasal spray    Sig: Place 2 sprays into both nostrils daily.    Dispense:  16 g    Refill:  6    Order Specific Question:   Supervising Provider    Answer:   Merrilee Jansky [id number]     *If you need refills on other medications prior to your next appointment, please contact your pharmacy*  Follow-Up: Call back or seek an in-person evaluation if the symptoms worsen or if the condition fails to improve as anticipated.  [facility] [phone]  Other Instructions Recommend Flonase and Mucinex.  Drink plenty of fluids and rest.  May take Tylenol or Ibuprofen as needed for pain.  Can take cough syrup as needed for cough.  If no improvement or symptoms become worse follow up for in person evaluation with your PCP or at Urgent Care.    If you have been instructed to have an in-person evaluation today at a local Urgent Care facility, please use the link below. It will take you to a list of all of our available [facility], including address, phone number and hours of operation. Please do not delay care.   Urgent Cares  If you or a family member  do not have a primary care provider, use the link below to schedule a visit and establish care. When you choose a [facility] primary care physician or advanced practice provider, you gain a long-term partner in health. Find a Primary Care Provider  Learn more about [facility] in-office and virtual care options: [facility]

## 2022-10-27 NOTE — Progress Notes (Signed)
Virtual Visit Consent   Angela Shepard, you are scheduled for a virtual visit with a [facility] provider today. Just as with appointments in the office, your consent must be obtained to participate. Your consent will be active for this visit and any virtual visit you may have with one of our providers in the next 365 days. If you have a MyChart account, a copy of this consent can be sent to you electronically.  As this is a virtual visit, video technology does not allow for your provider to perform a traditional examination. This may limit your provider's ability to fully assess your condition. If your provider identifies any concerns that need to be evaluated in person or the need to arrange testing (such as labs, EKG, etc.), we will make arrangements to do so. Although advances in technology are sophisticated, we cannot ensure that it will always work on either your end or our end. If the connection with a video visit is poor, the visit may have to be switched to a telephone visit. With either a video or telephone visit, we are not always able to ensure that we have a secure connection.  By engaging in this virtual visit, you consent to the provision of healthcare and authorize for your insurance to be billed (if applicable) for the services provided during this visit. Depending on your insurance coverage, you may receive a charge related to this service.  I need to obtain your verbal consent now. Are you willing to proceed with your visit today? Angela Shepard has provided verbal consent on 10/27/2022 for a virtual visit (video or telephone). Tylene Fantasia Ward, PA-C  Date: 10/27/2022 7:07 PM  Virtual Visit via Video Note   I, Tylene Fantasia Ward, connected with  Angela Shepard  ([id number], 05/21/1996) on 10/27/22 at  7:00 PM EDT by a video-enabled telemedicine application and verified that I am speaking with the correct person using two identifiers.  Location: Patient: Virtual Visit Location  Patient: Home Provider: Virtual Visit Location Provider: Home   I discussed the limitations of evaluation and management by telemedicine and the availability of in person appointments. The patient expressed understanding and agreed to proceed.    History of Present Illness: Angela Shepard is a 27 y.o. who identifies as a female who was assigned female at birth, and is being seen today for cough, congestion that started 2-3 days ago.  She is also complaining of chills, sweats, body aches. Denies n/v, shortness of breath, wheezing.  She denies h/o asthma. She is currently taking xyzal for allergy sx.  She has also taken tylenol cough with some improvement. She is also using an over the counter nasal spray with some improvement.    HPI: HPI  Problems:  Patient Active Problem List   Diagnosis Date Noted   Abdominal cramps 10/25/2022   Impaired fasting glucose 09/22/2022   Hyperlipidemia LDL goal <100 09/22/2022   Vitamin D deficiency 09/22/2022   Other fatigue 09/22/2022   Chronic migraine without aura without status migrainosus, not intractable 05/16/2021   Irritable bowel syndrome with diarrhea 05/16/2021   Generalized anxiety disorder 05/16/2021   Insomnia due to anxiety and fear 05/16/2021   Body mass index 26.0-26.9, adult 05/16/2021   Left ureteral stone 12/10/2018   Chronic migraine 11/08/2018    Allergies:  Allergies  Allergen Reactions   Amoxicillin    Penicillins Rash    Did it involve swelling of the face/tongue/throat, SOB, or low BP? Unknown Did it  involve sudden or severe rash/hives, skin peeling, or any reaction on the inside of your mouth or nose? Unknown Did you need to seek medical attention at a hospital or doctor's office? Unknown When did it last happen?    Childhood   If all above answers are "NO", may proceed with cephalosporin use.   Medications:  Current Outpatient Medications:    fluticasone (FLONASE) 50 MCG/ACT nasal spray, Place 2 sprays into both  nostrils daily., Disp: 16 g, Rfl: 6   promethazine-dextromethorphan (PROMETHAZINE-DM) 6.25-15 MG/5ML syrup, Take 5 mLs by mouth 4 (four) times daily as needed for cough., Disp: 118 mL, Rfl: 0   busPIRone (BUSPAR) 30 MG tablet, Take 1 tablet (30 mg total) by mouth 2 (two) times daily as needed., Disp: 60 tablet, Rfl: 3   hydrOXYzine (ATARAX) 50 MG tablet, Take 1 tablet (50 mg total) by mouth 2 (two) times daily as needed., Disp: 60 tablet, Rfl: 3   hyoscyamine (ANASPAZ) 0.125 MG TBDP disintergrating tablet, Place 1 tablet (0.125 mg total) under the tongue 2 (two) times daily as needed., Disp: 60 tablet, Rfl: 2   methylPREDNISolone (MEDROL DOSEPAK) 4 MG TBPK tablet, Take as directed on package. (Patient not taking: Reported on 07/29/2021), Disp: 21 tablet, Rfl: 0   propranolol ER (INDERAL LA) 80 MG 24 hr capsule, Take 1 capsule (80 mg total) by mouth daily., Disp: 30 capsule, Rfl: 3   sertraline (ZOLOFT) 100 MG tablet, Take 2 tablets po QHS, Disp: 60 tablet, Rfl: 3  Observations/Objective: Patient is well-developed, well-nourished in no acute distress.  Resting comfortably at home.  Head is normocephalic, atraumatic.  No labored breathing.  Speech is clear and coherent with logical content.  Patient is alert and oriented at baseline.    Assessment and Plan: 1. Viral upper respiratory tract infection - promethazine-dextromethorphan (PROMETHAZINE-DM) 6.25-15 MG/5ML syrup; Take 5 mLs by mouth 4 (four) times daily as needed for cough.  Dispense: 118 mL; Refill: 0  2. Acute cough - promethazine-dextromethorphan (PROMETHAZINE-DM) 6.25-15 MG/5ML syrup; Take 5 mLs by mouth 4 (four) times daily as needed for cough.  Dispense: 118 mL; Refill: 0  Pt in no acute distress, speaking in complete sentences.  Supportive care discussed.   Follow Up Instructions: I discussed the assessment and treatment plan with the patient. The patient was provided an opportunity to ask questions and all were answered. The  patient agreed with the plan and demonstrated an understanding of the instructions.  A copy of instructions were sent to the patient via MyChart unless otherwise noted below.     The patient was advised to call back or seek an in-person evaluation if the symptoms worsen or if the condition fails to improve as anticipated.  Time:  I spent 11 minutes with the patient via telehealth technology discussing the above problems/concerns.    Tylene Fantasia Ward, PA-C

## 2022-12-12 NOTE — Progress Notes (Signed)
ASCUS Pap with HPV negative.  Repeat Pap smear in 1 year.  Vitamin D deficient.  Recommend over-the-counter vitamin D 5000 international units daily.  Other labs normal.

## 2022-12-22 ENCOUNTER — Ambulatory Visit: Payer: 59 | Admitting: Nurse Practitioner

## 2023-01-04 ENCOUNTER — Encounter: Payer: Self-pay | Admitting: Family Medicine

## 2023-01-04 ENCOUNTER — Ambulatory Visit (INDEPENDENT_AMBULATORY_CARE_PROVIDER_SITE_OTHER): Payer: Self-pay | Admitting: Family Medicine

## 2023-01-04 VITALS — BP 110/78 | HR 84 | Ht 64.0 in | Wt 146.2 lb

## 2023-01-04 DIAGNOSIS — F5105 Insomnia due to other mental disorder: Secondary | ICD-10-CM

## 2023-01-04 DIAGNOSIS — F411 Generalized anxiety disorder: Secondary | ICD-10-CM

## 2023-01-04 DIAGNOSIS — G43709 Chronic migraine without aura, not intractable, without status migrainosus: Secondary | ICD-10-CM

## 2023-01-04 DIAGNOSIS — F409 Phobic anxiety disorder, unspecified: Secondary | ICD-10-CM

## 2023-01-04 MED ORDER — SERTRALINE HCL 100 MG PO TABS
200.0000 mg | ORAL_TABLET | Freq: Every day | ORAL | 1 refills | Status: DC
Start: 2023-01-04 — End: 2023-04-12

## 2023-01-04 MED ORDER — BUSPIRONE HCL 30 MG PO TABS
30.0000 mg | ORAL_TABLET | Freq: Two times a day (BID) | ORAL | 1 refills | Status: DC | PRN
Start: 2023-01-04 — End: 2023-04-12

## 2023-01-04 MED ORDER — PROPRANOLOL HCL ER 80 MG PO CP24
80.0000 mg | ORAL_CAPSULE | Freq: Every day | ORAL | 1 refills | Status: DC
Start: 2023-01-04 — End: 2023-04-12

## 2023-01-04 MED ORDER — HYDROXYZINE HCL 50 MG PO TABS
50.0000 mg | ORAL_TABLET | Freq: Two times a day (BID) | ORAL | 3 refills | Status: DC | PRN
Start: 2023-01-04 — End: 2023-02-22

## 2023-01-04 NOTE — Assessment & Plan Note (Signed)
PHQ-9 score 3, GAD-7 score 4.  Improved from last visit.  Continue Zoloft 100 mg daily, buspirone 30 mg twice daily.  Will continue to monitor.

## 2023-01-04 NOTE — Progress Notes (Signed)
Established Patient Office Visit  Subjective   Patient ID: Angela Shepard, female    DOB: 1995-10-29  Age: 27 y.o. MRN: [id number]  Chief Complaint  Patient presents with   Follow up Mood   Migraine    HPI Angela Shepard is a 27 y.o. female presenting today for follow up of migraines, mood.  She works as a Museum/gallery conservator. Migraines: Well-controlled on propranolol 80 mg daily.  It prevents migraines well. Mood: Patient is here to follow up for anxiety, currently managing with BuSpar twice a day and Zoloft which was increased at last visit. Taking medication without side effects, reports excellent compliance with treatment. Denies mood changes or SI/HI. She feels mood is improved since last visit.     01/04/2023    4:10 PM 09/22/2022    9:54 AM 07/29/2021    2:19 PM  Depression screen PHQ 2/9  Decreased Interest 0 1 0  Down, Depressed, Hopeless 0 1 0  PHQ - 2 Score 0 2 0  Altered sleeping 3 3 1   Tired, decreased energy 0 3 0  Change in appetite 0 2 0  Feeling bad or failure about yourself  0 0 0  Trouble concentrating 0 1 0  Moving slowly or fidgety/restless 0 0 0  Suicidal thoughts 0 0 0  PHQ-9 Score 3 11 1   Difficult doing work/chores Somewhat difficult         01/04/2023    4:10 PM 07/29/2021    2:21 PM 07/07/2021    3:14 PM 05/12/2021    1:47 PM  GAD 7 : Generalized Anxiety Score  Nervous, Anxious, on Edge 0 1 0 2  Control/stop worrying 1 1 1 2   Worry too much - different things 1 1 1 2   Trouble relaxing 1 1 1 2   Restless 1 0 0 2  Easily annoyed or irritable 0 1 1 2   Afraid - awful might happen 0 0 0 0  Total GAD 7 Score 4 5 4 12   Anxiety Difficulty Somewhat difficult  Not difficult at all    ROS Negative unless otherwise noted in HPI   Objective:     BP 110/78   Pulse 84   Ht 5\' 4"  (1.626 m)   Wt 146 lb 4 oz (66.3 kg)   LMP 12/12/2022 (Approximate)   SpO2 98%   BMI 25.10 kg/m   Physical Exam Constitutional:      General: She is not in acute  distress.    Appearance: Normal appearance.  HENT:     Head: Normocephalic and atraumatic.  Pulmonary:     Effort: Pulmonary effort is normal. No respiratory distress.  Musculoskeletal:     Cervical back: Normal range of motion.  Neurological:     General: No focal deficit present.     Mental Status: She is alert and oriented to person, place, and time. Mental status is at baseline.  Psychiatric:        Mood and Affect: Mood normal.        Speech: Speech normal.        Behavior: Behavior normal. Behavior is cooperative.        Thought Content: Thought content normal.        Cognition and Memory: Cognition normal.        Judgment: Judgment normal.      Assessment & Plan:  Generalized anxiety disorder Assessment & Plan: PHQ-9 score 3, GAD-7 score 4.  Improved from last visit.  Continue Zoloft  100 mg daily, buspirone 30 mg twice daily.  Will continue to monitor.  Orders: -     busPIRone HCl; Take 1 tablet (30 mg total) by mouth 2 (two) times daily as needed.  Dispense: 180 tablet; Refill: 1 -     Sertraline HCl; Take 2 tablets (200 mg total) by mouth at bedtime.  Dispense: 180 tablet; Refill: 1 -     hydrOXYzine HCl; Take 1 tablet (50 mg total) by mouth 2 (two) times daily as needed.  Dispense: 60 tablet; Refill: 3  Chronic migraine without aura without status migrainosus, not intractable Assessment & Plan: Stable.  Continue propranolol 80 mg daily for prophylaxis.  Will continue to monitor.  Orders: -     Propranolol HCl ER; Take 1 capsule (80 mg total) by mouth daily.  Dispense: 90 capsule; Refill: 1  Insomnia due to anxiety and fear -     hydrOXYzine HCl; Take 1 tablet (50 mg total) by mouth 2 (two) times daily as needed.  Dispense: 60 tablet; Refill: 3    Return in about 4 months (around 05/07/2023) for follow-up for migraines and mood, in person or video.  Next appointment after that will be annual physical in April 2025.   Melida Quitter, PA

## 2023-01-04 NOTE — Assessment & Plan Note (Signed)
Stable.  Continue propranolol 80 mg daily for prophylaxis.  Will continue to monitor.

## 2023-02-22 ENCOUNTER — Encounter: Payer: Self-pay | Admitting: Family Medicine

## 2023-02-22 ENCOUNTER — Ambulatory Visit (INDEPENDENT_AMBULATORY_CARE_PROVIDER_SITE_OTHER): Payer: Self-pay | Admitting: Family Medicine

## 2023-02-22 VITALS — BP 110/79 | HR 86 | Resp 18 | Ht 64.0 in | Wt 141.0 lb

## 2023-02-22 DIAGNOSIS — Z3A01 Less than 8 weeks gestation of pregnancy: Secondary | ICD-10-CM

## 2023-02-22 DIAGNOSIS — O219 Vomiting of pregnancy, unspecified: Secondary | ICD-10-CM

## 2023-02-22 MED ORDER — DOXYLAMINE-PYRIDOXINE 10-10 MG PO TBEC
1.0000 | DELAYED_RELEASE_TABLET | Freq: Two times a day (BID) | ORAL | 1 refills | Status: DC | PRN
Start: 2023-02-22 — End: 2023-05-16

## 2023-02-22 NOTE — Progress Notes (Signed)
Acute Office Visit  Subjective:     Patient ID: Angela Shepard, female    DOB: [birth year], 27 y.o.   MRN: [id number]  Chief Complaint  Patient presents with   Anxiety    HPI Patient is in today to discuss medication and pregnancy.  She recently had a positive home pregnancy test which was a surprise for her.  She immediately had questions about the medications that she is currently taking as well as her habits of vaping and smoking marijuana.  Since learning that she is pregnant, she has cut back from vaping throughout the day to only 2 hits a day.  She has completely stopped smoking marijuana.  She is not feeling well and is not sure if it is the pregnancy or withdrawal from nicotine/marijuana.  She has an upcoming appointment with OB/GYN on 02/27/2023, though she is not sure if that is an initial prenatal appointment or just a pregnancy test.  Current Outpatient Medications  Medication Instructions   busPIRone (BUSPAR) 30 mg, Oral, 2 times daily PRN   Doxylamine-Pyridoxine 10-10 MG TBEC 1 tablet, Oral, 2 times daily PRN   fluticasone (FLONASE) 50 MCG/ACT nasal spray 2 sprays, Each Nare, Daily   propranolol ER (INDERAL LA) 80 mg, Oral, Daily   sertraline (ZOLOFT) 200 mg, Oral, Daily at bedtime      ROS Denies abdominal pain, vaginal bleeding, fever, chills. Endorses nausea.    Objective:    BP 110/79 (BP Location: Right Arm, Patient Position: Sitting, Cuff Size: Normal)   Pulse 86   Resp 18   Ht 5\' 4"  (1.626 m)   Wt 141 lb (64 kg)   LMP 01/12/2023 (Exact Date)   SpO2 97%   BMI 24.20 kg/m   Physical Exam Constitutional:      General: She is not in acute distress.    Appearance: Normal appearance.  HENT:     Head: Normocephalic and atraumatic.  Cardiovascular:     Rate and Rhythm: Normal rate and regular rhythm.     Heart sounds: No murmur heard.    No friction rub. No gallop.  Pulmonary:     Effort: Pulmonary effort is normal. No respiratory distress.     Breath  sounds: No wheezing, rhonchi or rales.  Abdominal:     General: Abdomen is flat. Bowel sounds are normal. There is no distension.     Palpations: Abdomen is soft. There is no mass.     Tenderness: There is abdominal tenderness (Mild suprapubic discomfort) in the suprapubic area. There is no guarding.  Skin:    General: Skin is warm and dry.  Neurological:     Mental Status: She is alert and oriented to person, place, and time.      Assessment & Plan:  Nausea and vomiting during pregnancy -     Doxylamine-Pyridoxine; Take 1 tablet by mouth 2 (two) times daily as needed.  Dispense: 60 tablet; Refill: 1  Less than [redacted] weeks gestation of pregnancy  We discussed each of her current medications.  I advised that she can continue taking sertraline 200 mg daily.  Buspirone has mixed data on its safety profile during pregnancy and it is recommended to practice shared decision making between [facility] provider and the patient, so she will hold for now and discuss at initial prenatal visit.  Advised to stop hydroxyzine and stop propranolol.  Propranolol has worked well for migraine prophylaxis, so we could consider switching to labetalol for migraine prophylaxis as it is also  in the beta-blocker category.  She will discuss this more with OB/GYN at her initial prenatal visit as well.  Provided prescription for Diclegis for nausea and vomiting and recommended nonpharmacologic measures like crackers, bland food, hydration, and ginger.  Advised that smoking cessation and complete discontinuation of marijuana use is advised in pregnancy.  Recommended counseling or behavioral therapy to aid in the transition.  OB/GYN will also have resources if therapy alone is not adequate.  Return if symptoms worsen or fail to improve.  I spent 30 minutes on the day of the encounter to include pre-visit record review, face-to-face time with the patient answering questions and providing education, and post visit ordering of  medication and coordination of care.  Melida Quitter, PA

## 2023-02-22 NOTE — Patient Instructions (Addendum)
NAUSEA: -crackers, frequent small meals, avoid spicy foods -ginger, minty gum -1st line medicine: doxylamine-pyridoxine (I sent some in for you!)  MEDICINES: -CONTINUE Zoloft -STOP hydroxyzine and propranolol. STOP hyoscyamine.  -I will contact your OBGYN to discuss switching propranolol to labetalol during pregnancy for migraine prevention -HOLD buspirone and discuss with your OB  You can start with therapy that can be very helpful with stopping vaping! The OB will also have resources if therapy alone is not enough.

## 2023-02-27 ENCOUNTER — Ambulatory Visit (INDEPENDENT_AMBULATORY_CARE_PROVIDER_SITE_OTHER): Payer: Self-pay

## 2023-02-27 VITALS — BP 120/79 | HR 81 | Ht 62.0 in | Wt 141.0 lb

## 2023-02-27 DIAGNOSIS — Z3201 Encounter for pregnancy test, result positive: Secondary | ICD-10-CM

## 2023-02-27 DIAGNOSIS — O219 Vomiting of pregnancy, unspecified: Secondary | ICD-10-CM

## 2023-02-27 LAB — POCT URINE PREGNANCY: Preg Test, Ur: POSITIVE — AB

## 2023-02-27 MED ORDER — PROMETHAZINE HCL 25 MG PO TABS
25.0000 mg | ORAL_TABLET | Freq: Four times a day (QID) | ORAL | 2 refills | Status: DC | PRN
Start: 2023-02-27 — End: 2023-03-09

## 2023-02-27 NOTE — Progress Notes (Signed)
Angela Shepard here for a UPT. Pt had a positive upt at home. LMP is 01/12/2023.   UPT in office Positive.    Reviewed medications and informed patient to follow up for New OB intake on 04/04/2023.

## 2023-03-05 ENCOUNTER — Encounter: Payer: Self-pay | Admitting: Family Medicine

## 2023-03-05 ENCOUNTER — Encounter: Payer: Self-pay | Admitting: Obstetrics and Gynecology

## 2023-03-05 DIAGNOSIS — B354 Tinea corporis: Secondary | ICD-10-CM

## 2023-03-06 MED ORDER — KETOCONAZOLE 2 % EX CREA
1.0000 | TOPICAL_CREAM | Freq: Every day | CUTANEOUS | 0 refills | Status: DC
Start: 2023-03-06 — End: 2023-03-08

## 2023-03-08 ENCOUNTER — Other Ambulatory Visit: Payer: Self-pay

## 2023-03-08 MED ORDER — KETOCONAZOLE 2 % EX CREA: 1.0000 | TOPICAL_CREAM | Freq: Every day | CUTANEOUS | 0 refills | Status: DC

## 2023-03-08 NOTE — Addendum Note (Signed)
Addended by: Tonny Bollman on: 03/08/2023 01:02 PM   Modules accepted: Orders

## 2023-03-08 NOTE — Telephone Encounter (Signed)
NOTE NOT NEEDED ?

## 2023-03-09 ENCOUNTER — Other Ambulatory Visit: Payer: Self-pay

## 2023-03-09 DIAGNOSIS — O219 Vomiting of pregnancy, unspecified: Secondary | ICD-10-CM

## 2023-03-09 MED ORDER — PROMETHAZINE HCL 25 MG PO TABS
25.0000 mg | ORAL_TABLET | Freq: Four times a day (QID) | ORAL | 2 refills | Status: DC | PRN
Start: 2023-03-09 — End: 2023-05-16

## 2023-04-04 ENCOUNTER — Ambulatory Visit (INDEPENDENT_AMBULATORY_CARE_PROVIDER_SITE_OTHER): Payer: Medicaid Other | Admitting: *Deleted

## 2023-04-04 ENCOUNTER — Other Ambulatory Visit (HOSPITAL_COMMUNITY)
Admission: RE | Admit: 2023-04-04 | Discharge: 2023-04-04 | Disposition: A | Discharge status: Home / Self Care | Payer: Medicaid Other | Source: Ambulatory Visit | Attending: Obstetrics and Gynecology | Admitting: Obstetrics and Gynecology

## 2023-04-04 ENCOUNTER — Other Ambulatory Visit (INDEPENDENT_AMBULATORY_CARE_PROVIDER_SITE_OTHER): Payer: Medicaid Other

## 2023-04-04 VITALS — BP 110/73 | HR 82 | Wt 140.5 lb

## 2023-04-04 DIAGNOSIS — Z3480 Encounter for supervision of other normal pregnancy, unspecified trimester: Secondary | ICD-10-CM | POA: Insufficient documentation

## 2023-04-04 DIAGNOSIS — O219 Vomiting of pregnancy, unspecified: Secondary | ICD-10-CM

## 2023-04-04 DIAGNOSIS — O3680X0 Pregnancy with inconclusive fetal viability, not applicable or unspecified: Secondary | ICD-10-CM

## 2023-04-04 DIAGNOSIS — Z1339 Encounter for screening examination for other mental health and behavioral disorders: Secondary | ICD-10-CM | POA: Diagnosis not present

## 2023-04-04 DIAGNOSIS — Z3401 Encounter for supervision of normal first pregnancy, first trimester: Secondary | ICD-10-CM

## 2023-04-04 DIAGNOSIS — Z3A11 11 weeks gestation of pregnancy: Secondary | ICD-10-CM | POA: Diagnosis not present

## 2023-04-04 MED ORDER — TRANSDERM-SCOP 1 MG/3DAYS TD PT72: 1.0000 | MEDICATED_PATCH | TRANSDERMAL | 1 refills | Status: DC

## 2023-04-04 MED ORDER — BLOOD PRESSURE KIT DEVI: 1.0000 | 0 refills | Status: DC

## 2023-04-04 NOTE — Progress Notes (Signed)
Pt reports frequent N/V. Mucous membranes and lips are very dry. Pt reports she is able to keep down some food and fluids intermittently. Reports phenergan is not helping much. RX Diclegis was to expensive. Dr. Jolayne Panther consulted. RX scopolamine and OTC Unisom and Vit B6 recommended. Pt advised and verbalized understanding. Standard early pregnancy N/V suggestions provided. Advised to seek care in MAU if she is unable to keep down any food or fluid. Directions provided.

## 2023-04-04 NOTE — Patient Instructions (Signed)

## 2023-04-04 NOTE — Progress Notes (Signed)
New OB Intake  I connected with Angela Shepard  on 04/04/23 at 10:15 AM EDT by In PersoVisit and verified that I am speaking with the correct person using two identifiers. Nurse is located at [facility] and pt is located at [place].  I discussed the limitations, risks, security and privacy concerns of performing an evaluation and management service by telephone and the availability of in person appointments. I also discussed with the patient that there may be a patient responsible charge related to this service. The patient expressed understanding and agreed to proceed.  I explained I am completing New OB Intake today. We discussed EDD of 10/19/2023, by Last Menstrual Period. Pt is G1P0. I reviewed her allergies, medications and Medical/Surgical/OB history.    Patient Active Problem List   Diagnosis Date Noted   Abdominal cramps 10/25/2022   Impaired fasting glucose 09/22/2022   Hyperlipidemia LDL goal <100 09/22/2022   Vitamin D deficiency 09/22/2022   Other fatigue 09/22/2022   Irritable bowel syndrome with diarrhea 05/16/2021   Generalized anxiety disorder 05/16/2021   Insomnia due to anxiety and fear 05/16/2021   Body mass index 26.0-26.9, adult 05/16/2021   Chronic migraine without aura without status migrainosus, not intractable 11/08/2018    Concerns addressed today  Delivery Plans Plans to deliver at [facility]. Discussed the nature of our practice with multiple providers including residents and students. Due to the size of the practice, the delivering provider may not be the same as those providing prenatal care.   Patient is not interested in water birth. Offered upcoming OB visit with CNM to discuss further.  MyChart/Babyscripts MyChart access verified. I explained pt will have some visits in office and some virtually. Babyscripts instructions given and order placed. Patient verifies receipt of registration text/e-mail. Account successfully created and app downloaded.  Blood  Pressure Cuff/Weight Scale Blood pressure cuff ordered for patient to pick-up from [employer]. Explained after first prenatal appt pt will check weekly and document in Babyscripts. Patient does not have weight scale; patient may purchase if they desire to track weight weekly in Babyscripts.  Anatomy US Explained first scheduled [place] will be around 19 weeks. Anatomy US scheduled for TBD at TBD.  Interested in [place]? If yes, send referral and doula dot phrase.   Is patient a candidate for Babyscripts Optimization? Yes  First visit review I reviewed new OB appt with patient. Explained pt will be seen by Dr. Donavan Foil at first visit. Discussed Avelina Laine genetic screening with patient. Requests Panorama and Horizon.. Routine prenatal labs  OB Panel, OB Urine, GC/CC, Natera collected today.    Last Pap Diagnosis  Date Value Ref Range Status  09/22/2022 (A)  Final   - Atypical squamous cells of undetermined significance (ASC-US)    Harrel Lemon, RN 04/04/2023  10:21 AM

## 2023-04-05 LAB — CBC/D/PLT+RPR+RH+ABO+RUBIGG...
Antibody Screen: NEGATIVE
Basophils Absolute: 0 10*3/uL (ref 0.0–0.2)
Basos: 0 %
EOS (ABSOLUTE): 0 10*3/uL (ref 0.0–0.4)
Eos: 0 %
HCV Ab: NONREACTIVE
HIV Screen 4th Generation wRfx: NONREACTIVE
Hematocrit: 39.5 % (ref 34.0–46.6)
Hemoglobin: 13.1 g/dL (ref 11.1–15.9)
Hepatitis B Surface Ag: NEGATIVE
Immature Grans (Abs): 0 10*3/uL (ref 0.0–0.1)
Immature Granulocytes: 0 %
Lymphocytes Absolute: 1.4 10*3/uL (ref 0.7–3.1)
Lymphs: 13 %
MCH: 30.4 pg (ref 26.6–33.0)
MCHC: 33.2 g/dL (ref 31.5–35.7)
MCV: 92 fL (ref 79–97)
Monocytes Absolute: 0.6 10*3/uL (ref 0.1–0.9)
Monocytes: 5 %
Neutrophils Absolute: 8.9 10*3/uL — ABNORMAL HIGH (ref 1.4–7.0)
Neutrophils: 82 %
Platelets: 281 10*3/uL (ref 150–450)
RBC: 4.31 x10E6/uL (ref 3.77–5.28)
RDW: 12.7 % (ref 11.7–15.4)
RPR Ser Ql: NONREACTIVE
Rh Factor: POSITIVE
Rubella Antibodies, IGG: 6.14 {index} (ref >0.99)
WBC: 11 10*3/uL — ABNORMAL HIGH (ref 3.4–10.8)

## 2023-04-05 LAB — CERVICOVAGINAL ANCILLARY ONLY
Chlamydia: NEGATIVE
Comment: NEGATIVE
Comment: NORMAL
Neisseria Gonorrhea: NEGATIVE

## 2023-04-05 LAB — HCV INTERPRETATION

## 2023-04-07 LAB — CULTURE, OB URINE

## 2023-04-07 LAB — URINE CULTURE, OB REFLEX

## 2023-04-09 LAB — PANORAMA PRENATAL TEST FULL PANEL:PANORAMA TEST PLUS 5 ADDITIONAL MICRODELETIONS: FETAL FRACTION: 7.2

## 2023-04-12 ENCOUNTER — Inpatient Hospital Stay (HOSPITAL_COMMUNITY)
Admission: AD | Admit: 2023-04-12 | Discharge: 2023-04-12 | Disposition: A | Discharge status: Home / Self Care | Payer: Medicaid Other | Attending: Obstetrics and Gynecology | Admitting: Obstetrics and Gynecology

## 2023-04-12 ENCOUNTER — Other Ambulatory Visit: Payer: Self-pay

## 2023-04-12 ENCOUNTER — Encounter: Payer: Medicaid Other | Admitting: Obstetrics and Gynecology

## 2023-04-12 DIAGNOSIS — Z3A12 12 weeks gestation of pregnancy: Secondary | ICD-10-CM | POA: Diagnosis not present

## 2023-04-12 DIAGNOSIS — O219 Vomiting of pregnancy, unspecified: Secondary | ICD-10-CM | POA: Insufficient documentation

## 2023-04-12 DIAGNOSIS — R112 Nausea with vomiting, unspecified: Secondary | ICD-10-CM | POA: Diagnosis present

## 2023-04-12 DIAGNOSIS — Z79899 Other long term (current) drug therapy: Secondary | ICD-10-CM | POA: Insufficient documentation

## 2023-04-12 DIAGNOSIS — Z3480 Encounter for supervision of other normal pregnancy, unspecified trimester: Secondary | ICD-10-CM

## 2023-04-12 LAB — URINALYSIS, ROUTINE W REFLEX MICROSCOPIC
Bilirubin Urine: NEGATIVE
Glucose, UA: NEGATIVE mg/dL
Hgb urine dipstick: NEGATIVE
Ketones, ur: 20 mg/dL — AB
Nitrite: NEGATIVE
Protein, ur: 30 mg/dL — AB
Specific Gravity, Urine: 1.023 (ref 1.005–1.030)
pH: 6 (ref 5.0–8.0)

## 2023-04-12 LAB — HORIZON CUSTOM: REPORT SUMMARY: NEGATIVE

## 2023-04-12 MED ORDER — ONDANSETRON 4 MG PO TBDP
4.0000 mg | ORAL_TABLET | Freq: Once | ORAL | Status: AC
  Administered 2023-04-12: 4 mg via ORAL
  Filled 2023-04-12: qty 1

## 2023-04-12 MED ORDER — ONDANSETRON 4 MG PO TBDP: 8.0000 mg | ORAL_TABLET | Freq: Once | ORAL | Status: DC

## 2023-04-12 MED ORDER — ONDANSETRON 4 MG PO TBDP: 4.0000 mg | ORAL_TABLET | Freq: Four times a day (QID) | ORAL | 1 refills | Status: DC | PRN

## 2023-04-12 NOTE — MAU Provider Note (Signed)
History     CSN: [id number]  Arrival date and time: 04/12/23 1201   Event Date/Time   First Provider Initiated Contact with Patient 04/12/23 1349      Chief Complaint  Patient presents with   Emesis   Nausea    Angela Shepard is a 27 y.o. G1P0 at [redacted]w[redacted]d who receives care at [facility].  She presents today for nausea and vomiting.  She states she has been "throwing up a lot" since about 6 weeks.  She currently has on scop patch and has thrown up ~ 12x.  She states she has not tried any additional medications today.  She reports last incident was ~ 2 hours ago.  She states that she has been eating goldfish and drinking gatorade since arrival.  Patient denies vaginal concerns including bleeding and abnormal discharge. No abdominal cramping, but some back pain.   Medications: Promethazine, Unisom 25mg  (none recently), Did not pick up [place] d/t costs.  OB History     Gravida  1   Para  0   Term  0   Preterm  0   AB  0   Living  0      SAB  0   IAB  0   Ectopic  0   Multiple  0   Live Births  0           Past Medical History:  Diagnosis Date   ADHD    Anxiety    Depression     Past Surgical History:  Procedure Laterality Date   CYSTOSCOPY WITH RETROGRADE PYELOGRAM, URETEROSCOPY AND STENT PLACEMENT Left 12/10/2018   Procedure: CYSTOSCOPY WITH RETROGRADE PYELOGRAM, URETEROSCOPY AND STENT PLACEMENT basket extraction;  Surgeon: Heloise Purpura, MD;  Location: [facility];  Service: Urology;  Laterality: Left;    Family History  Problem Relation Age of Onset   ADD / ADHD Mother    ADD / ADHD Father    Arthritis Father    Autism Brother     Social History   Tobacco Use   Smoking status: Never   Smokeless tobacco: Never  Vaping Use   Vaping status: Every Day  Substance Use Topics   Alcohol use: Never   Drug use: Yes    Types: Marijuana    Allergies:  Allergies  Allergen Reactions   Amoxicillin    Penicillins Rash    Did it involve swelling  of the face/tongue/throat, SOB, or low BP? Unknown Did it involve sudden or severe rash/hives, skin peeling, or any reaction on the inside of your mouth or nose? Unknown Did you need to seek medical attention at a hospital or doctor's office? Unknown When did it last happen?    Childhood   If all above answers are "NO", may proceed with cephalosporin use.    Medications Prior to Admission  Medication Sig Dispense Refill Last Dose   Blood Pressure Monitoring (BLOOD PRESSURE KIT) DEVI 1 Device by Does not apply route once a week. 1 each 0    busPIRone (BUSPAR) 30 MG tablet Take 1 tablet (30 mg total) by mouth 2 (two) times daily as needed. (Patient not taking: Reported on 02/27/2023) 180 tablet 1    Doxylamine-Pyridoxine 10-10 MG TBEC Take 1 tablet by mouth 2 (two) times daily as needed. (Patient not taking: Reported on 04/04/2023) 60 tablet 1    fluticasone (FLONASE) 50 MCG/ACT nasal spray Place 2 sprays into both nostrils daily. (Patient not taking: Reported on 02/27/2023) 16 g 6  ketoconazole (NIZORAL) 2 % cream Apply 1 Application topically daily. To affected areas. (Patient not taking: Reported on 04/04/2023) 60 g 0    Prenatal Vit-Fe Fumarate-FA (PRENATAL VITAMIN PO) Take 1 tablet by mouth daily.      promethazine (PHENERGAN) 25 MG tablet Take 1 tablet (25 mg total) by mouth every 6 (six) hours as needed for nausea or vomiting. 30 tablet 2    propranolol ER (INDERAL LA) 80 MG 24 hr capsule Take 1 capsule (80 mg total) by mouth daily. (Patient not taking: Reported on 02/27/2023) 90 capsule 1    scopolamine (TRANSDERM-SCOP) 1 MG/3DAYS Place 1 patch (1.5 mg total) onto the skin every 3 (three) days. 10 patch 1    sertraline (ZOLOFT) 100 MG tablet Take 2 tablets (200 mg total) by mouth at bedtime. (Patient not taking: Reported on 04/04/2023) 180 tablet 1     Review of Systems  Constitutional:  Negative for chills and fever.  Gastrointestinal:  Positive for nausea (None currently) and vomiting.  Negative for abdominal pain.  Genitourinary:  Positive for vaginal discharge (Denies abnormal factors). Negative for difficulty urinating, dysuria and vaginal bleeding.  Musculoskeletal:  Positive for back pain (Midback, bilaterally).  Neurological:  Positive for light-headedness. Negative for dizziness and headaches.   Physical Exam   Blood pressure 113/71, pulse 75, temperature 98.1 F (36.7 C), temperature source Oral, resp. rate 18, height 5\' 4"  (1.626 m), weight 64.2 kg, last menstrual period 01/12/2023, SpO2 100%.  Physical Exam Vitals reviewed.  Constitutional:      General: She is not in acute distress.    Appearance: Normal appearance.  HENT:     Head: Normocephalic and atraumatic.  Eyes:     Conjunctiva/sclera: Conjunctivae normal.  Cardiovascular:     Rate and Rhythm: Normal rate.  Pulmonary:     Effort: Pulmonary effort is normal. No respiratory distress.  Musculoskeletal:        General: Normal range of motion.     Cervical back: Normal range of motion.  Skin:    General: Skin is warm and dry.  Neurological:     Mental Status: She is alert and oriented to person, place, and time.  Psychiatric:        Mood and Affect: Mood normal.        Behavior: Behavior normal.     MAU Course  Procedures Results for orders placed or performed during the hospital encounter of 04/12/23 (from the past 24 hour(s))  Urinalysis, Routine w reflex microscopic -Urine, Clean Catch     Status: Abnormal   Collection Time: 04/12/23 12:17 PM  Result Value Ref Range   Color, Urine AMBER (A) YELLOW   APPearance HAZY (A) CLEAR   Specific Gravity, Urine 1.023 1.005 - 1.030   pH 6.0 5.0 - 8.0   Glucose, UA NEGATIVE NEGATIVE mg/dL   Hgb urine dipstick NEGATIVE NEGATIVE   Bilirubin Urine NEGATIVE NEGATIVE   Ketones, ur 20 (A) NEGATIVE mg/dL   Protein, ur 30 (A) NEGATIVE mg/dL   Nitrite NEGATIVE NEGATIVE   Leukocytes,Ua SMALL (A) NEGATIVE   RBC / HPF 0-5 0 - 5 RBC/hpf   WBC, UA 6-10 0 -  5 WBC/hpf   Bacteria, UA FEW (A) NONE SEEN   Squamous Epithelial / HPF 6-10 0 - 5 /HPF   Mucus PRESENT    Ca Oxalate Crys, UA PRESENT     MDM Antiemetic Labs: UA, UC Prescription Assessment and Plan  27 year old, G1P0  SIUP at 12.6 weeks  Nausea and Vomiting  -Reviewed POC with patient. -Informed that UA pending.  -Discussed oral medication and patient agreeable.  -Give Zofran 4mg  now.  -Informed that unless urine shows significant dehydration, will not give IV fluids in setting of eating and drinking w/o current vomiting. -Discussed taking Unisom in conjunction with B6 for nausea management.  -Patient verbalizes understanding and has no questions.    Cherre Robins 04/12/2023, 1:49 PM   Reassessment (2:22 PM) -UA returns as above. -Provider to bedside to discuss. -Informed that UA sent for culture d/t bacteria. -Encouraged to continue oral hydration. Discussed avoiding water and utilizing Gatorades or clear carbonation when vomiting. -Patient request and given work note.  -Patient also request and script sent for Zofran ODT. -Precautions reviewed. -Encouraged to call primary office or return to MAU if symptoms worsen or with the onset of new symptoms. -Discharged to home in stable condition.   Cherre Robins MSN, CNM Advanced Practice Provider, [facility]

## 2023-04-12 NOTE — MAU Note (Signed)
Pt has snacks and fluids, has been eating and drinking while waiting on urine results.  Has not been actively vomiting while here.

## 2023-04-12 NOTE — MAU Note (Signed)
Angela Shepard is a 27 y.o. at [redacted]w[redacted]d here in MAU reporting: she has N/V, has vomited 12-13 times since 0730 this morning.  States after eats or drinks vomits 20 minutes labor.   Denies VB or LOF. LMP: NA Onset of complaint: today Pain score: 0 Vitals:   04/12/23 1217  BP: 113/71  Pulse: 75  Resp: 18  Temp: 98.1 F (36.7 C)  SpO2: 100%     FHT:154 bpm Lab orders placed from triage:   UA

## 2023-04-14 LAB — CULTURE, OB URINE: Culture: 40000 — AB

## 2023-04-17 ENCOUNTER — Encounter: Payer: Self-pay | Admitting: Obstetrics and Gynecology

## 2023-04-18 ENCOUNTER — Telehealth: Payer: Self-pay

## 2023-04-18 NOTE — Telephone Encounter (Signed)
Patient called asking about possible urine culture results and treatment. Culture showed lactobacillus species 40,000 colonies. Reports some intermittent burning with urination and urinary frequency.   Reports an increase in vaginal discharge with a slight odor. Vaginal swab was only checked for GC/CH on 10/22. BV, yeast, and trich not checked. To late to add on to previously collected swab.  Patient scheduled for nurse visit self swab to check for other infections.   Please advise to possible treatment of UTI sx or wait to treat after vaginal swab results return?

## 2023-04-19 ENCOUNTER — Ambulatory Visit: Payer: Medicaid Other | Admitting: Emergency Medicine

## 2023-04-19 ENCOUNTER — Other Ambulatory Visit (HOSPITAL_COMMUNITY)
Admission: RE | Admit: 2023-04-19 | Discharge: 2023-04-19 | Disposition: A | Discharge status: Home / Self Care | Payer: Medicaid Other | Source: Ambulatory Visit | Attending: Obstetrics and Gynecology | Admitting: Obstetrics and Gynecology

## 2023-04-19 VITALS — BP 112/73 | HR 71 | Wt 143.0 lb

## 2023-04-19 DIAGNOSIS — N898 Other specified noninflammatory disorders of vagina: Secondary | ICD-10-CM | POA: Insufficient documentation

## 2023-04-19 DIAGNOSIS — R350 Frequency of micturition: Secondary | ICD-10-CM

## 2023-04-19 NOTE — Telephone Encounter (Signed)
Patient informed. Patient had appt this morning

## 2023-04-19 NOTE — Progress Notes (Signed)
SUBJECTIVE:  27 y.o. female complains of white vaginal discharge for 1 week(s). Denies abnormal vaginal bleeding or significant pelvic pain or fever. No UTI symptoms. Denies history of known exposure to STD.  Patient's last menstrual period was 01/12/2023 (exact date).  OBJECTIVE:  She appears well, afebrile. Urine dipstick: not done.  ASSESSMENT:  Vaginal Discharge  Vaginal Odor   PLAN:  GC, chlamydia, trichomonas, BVAG, CVAG probe sent to lab. Treatment: To be determined once lab results are received ROV prn if symptoms persist or worsen. Urine culture repeated.

## 2023-04-20 LAB — CERVICOVAGINAL ANCILLARY ONLY
Bacterial Vaginitis (gardnerella): NEGATIVE
Candida Glabrata: NEGATIVE
Candida Vaginitis: NEGATIVE
Chlamydia: NEGATIVE
Comment: NEGATIVE
Comment: NEGATIVE
Comment: NEGATIVE
Comment: NEGATIVE
Comment: NEGATIVE
Comment: NORMAL
Neisseria Gonorrhea: NEGATIVE
Trichomonas: NEGATIVE

## 2023-04-21 LAB — URINE CULTURE, OB REFLEX

## 2023-04-21 LAB — CULTURE, OB URINE

## 2023-04-27 ENCOUNTER — Ambulatory Visit: Payer: Medicaid Other | Admitting: Obstetrics and Gynecology

## 2023-04-27 VITALS — BP 114/70 | HR 75 | Wt 148.0 lb

## 2023-04-27 DIAGNOSIS — Z3A15 15 weeks gestation of pregnancy: Secondary | ICD-10-CM

## 2023-04-27 DIAGNOSIS — Z3480 Encounter for supervision of other normal pregnancy, unspecified trimester: Secondary | ICD-10-CM

## 2023-04-27 DIAGNOSIS — Z3402 Encounter for supervision of normal first pregnancy, second trimester: Secondary | ICD-10-CM

## 2023-04-27 NOTE — Progress Notes (Signed)
INITIAL PRENATAL VISIT NOTE  Subjective:  Angela Shepard is a 27 y.o. G1P0000 at [redacted]w[redacted]d by exact LMP being seen today for her initial prenatal visit.  She has an obstetric history significant for nulliparity. She has a medical history significant for past THC use and vaping.  Patient reports no complaints.  Contractions: Not present. Vag. Bleeding: None.   . Denies leaking of fluid.    Past Medical History:  Diagnosis Date   ADHD    Anxiety    Depression     Past Surgical History:  Procedure Laterality Date   CYSTOSCOPY WITH RETROGRADE PYELOGRAM, URETEROSCOPY AND STENT PLACEMENT Left 12/10/2018   Procedure: CYSTOSCOPY WITH RETROGRADE PYELOGRAM, URETEROSCOPY AND STENT PLACEMENT basket extraction;  Surgeon: Heloise Purpura, MD;  Location: [facility];  Service: Urology;  Laterality: Left;    OB History  Gravida Para Term Preterm AB Living  1 0 0 0 0 0  SAB IAB Ectopic Multiple Live Births  0 0 0 0 0    # Outcome Date GA Lbr Len/2nd Weight Sex Type Anes PTL Lv  1 Current             Social History   Socioeconomic History   Marital status: Single    Spouse name: Not on file   Number of children: Not on file   Years of education: Not on file   Highest education level: Not on file  Occupational History   Not on file  Tobacco Use   Smoking status: Never   Smokeless tobacco: Never  Vaping Use   Vaping status: Every Day  Substance and Sexual Activity   Alcohol use: Never   Drug use: Yes    Types: Marijuana   Sexual activity: Yes    Partners: Male  Other Topics Concern   Not on file  Social History Narrative   6 serving of caffeine daily    Social Determinants of Health   Financial Resource Strain: Not on file  Food Insecurity: Not on file  Transportation Needs: Not on file  Physical Activity: Not on file  Stress: Not on file  Social Connections: Not on file    Family History  Problem Relation Age of Onset   ADD / ADHD Mother    ADD / ADHD Father     Arthritis Father    Autism Brother      Current Outpatient Medications:    ondansetron (ZOFRAN-ODT) 4 MG disintegrating tablet, Take 1-2 tablets (4-8 mg total) by mouth every 6 (six) hours as needed for nausea or vomiting., Disp: 30 tablet, Rfl: 1   Prenatal Vit-Fe Fumarate-FA (PRENATAL VITAMIN PO), Take 1 tablet by mouth daily., Disp: , Rfl:    promethazine (PHENERGAN) 25 MG tablet, Take 1 tablet (25 mg total) by mouth every 6 (six) hours as needed for nausea or vomiting., Disp: 30 tablet, Rfl: 2   scopolamine (TRANSDERM-SCOP) 1 MG/3DAYS, Place 1 patch (1.5 mg total) onto the skin every 3 (three) days., Disp: 10 patch, Rfl: 1   Blood Pressure Monitoring (BLOOD PRESSURE KIT) DEVI, 1 Device by Does not apply route once a week. (Patient not taking: Reported on 04/27/2023), Disp: 1 each, Rfl: 0   Doxylamine-Pyridoxine 10-10 MG TBEC, Take 1 tablet by mouth 2 (two) times daily as needed. (Patient not taking: Reported on 04/04/2023), Disp: 60 tablet, Rfl: 1  Allergies  Allergen Reactions   Amoxicillin    Penicillins Rash    Did it involve swelling of the face/tongue/throat, SOB, or low  BP? Unknown Did it involve sudden or severe rash/hives, skin peeling, or any reaction on the inside of your mouth or nose? Unknown Did you need to seek medical attention at a hospital or doctor's office? Unknown When did it last happen?    Childhood   If all above answers are "NO", may proceed with cephalosporin use.    Review of Systems: Negative except for what is mentioned in HPI.  Objective:   Vitals:   04/27/23 1605  BP: 114/70  Pulse: 75  Weight: 148 lb (67.1 kg)    Fetal Status: Fetal Heart Rate (bpm): 150         Physical Exam: BP 114/70   Pulse 75   Wt 148 lb (67.1 kg)   LMP 01/12/2023 (Exact Date)   BMI 25.40 kg/m  CONSTITUTIONAL: Well-developed, well-nourished female in no acute distress.  NEUROLOGIC: Alert and oriented to person, place, and time. Normal reflexes, muscle tone  coordination. No cranial nerve deficit noted. PSYCHIATRIC: Normal mood and affect. Normal behavior. Normal judgment and thought content. SKIN: Skin is warm and dry. No rash noted. Not diaphoretic. No erythema. No pallor. HENT:  Normocephalic, atraumatic, External right and left ear normal. Oropharynx is clear and moist EYES: Conjunctivae and EOM are normal.  NECK: Normal range of motion, supple, no masses CARDIOVASCULAR: Normal heart rate noted, regular rhythm RESPIRATORY: Effort and breath sounds normal, no problems with respiration noted BREASTS: deferred ABDOMEN: Soft, nontender, nondistended, gravid. GU: deferred MUSCULOSKELETAL: Normal range of motion. EXT:  No edema and no tenderness. 2+ distal pulses.   Assessment and Plan:  Pregnancy: G1P0000 at [redacted]w[redacted]d by LMP  1. [redacted] weeks gestation of pregnancy   2. Supervision of other normal pregnancy, antepartum Continue routine prenatal care   Preterm labor symptoms and general obstetric precautions including but not limited to vaginal bleeding, contractions, leaking of fluid and fetal movement were reviewed in detail with the patient.  Please refer to After Visit Summary for other counseling recommendations.   Return in about 4 weeks (around 05/25/2023) for ROB, in person.  Warden Fillers 04/27/2023 4:40 PM

## 2023-04-27 NOTE — Progress Notes (Signed)
NOB is in office for initial provider visit. Intake, labs and u/s completed on 04/04/23 Last pap 09/22/22

## 2023-05-08 ENCOUNTER — Ambulatory Visit: Payer: Medicaid Other | Admitting: Family Medicine

## 2023-05-16 ENCOUNTER — Telehealth: Payer: Medicaid Other | Admitting: Physician Assistant

## 2023-05-16 DIAGNOSIS — J069 Acute upper respiratory infection, unspecified: Secondary | ICD-10-CM | POA: Diagnosis not present

## 2023-05-16 MED ORDER — COVID-19 AT HOME ANTIGEN TEST VI KIT: PACK | 0 refills | Status: DC

## 2023-05-16 NOTE — Patient Instructions (Signed)
Angela Shepard, thank you for joining [name], [name]  While this provider is not your primary care provider (PCP), if your PCP is located in our provider database this encounter information will be shared with them immediately following your visit.   A [facility] MyChart account gives you access to today's visit and all your visits, tests, and labs performed at [facility] " click here if you don't have a [facility] MyChart account or go to [email or web address]  Consent: (Patient) ANNALENE SCHOR provided verbal consent for this virtual visit at the beginning of the encounter.  Current Medications:  Current Outpatient Medications:    Blood Pressure Monitoring (BLOOD PRESSURE KIT) DEVI, 1 Device by Does not apply route once a week. (Patient not taking: Reported on 04/27/2023), Disp: 1 each, Rfl: 0   Doxylamine-Pyridoxine 10-10 MG TBEC, Take 1 tablet by mouth 2 (two) times daily as needed. (Patient not taking: Reported on 04/04/2023), Disp: 60 tablet, Rfl: 1   ondansetron (ZOFRAN-ODT) 4 MG disintegrating tablet, Take 1-2 tablets (4-8 mg total) by mouth every 6 (six) hours as needed for nausea or vomiting., Disp: 30 tablet, Rfl: 1   Prenatal Vit-Fe Fumarate-FA (PRENATAL VITAMIN PO), Take 1 tablet by mouth daily., Disp: , Rfl:    promethazine (PHENERGAN) 25 MG tablet, Take 1 tablet (25 mg total) by mouth every 6 (six) hours as needed for nausea or vomiting., Disp: 30 tablet, Rfl: 2   scopolamine (TRANSDERM-SCOP) 1 MG/3DAYS, Place 1 patch (1.5 mg total) onto the skin every 3 (three) days., Disp: 10 patch, Rfl: 1   Medications ordered in this encounter:  No orders of the defined types were placed in this encounter.    *If you need refills on other medications prior to your next appointment, please contact your pharmacy*  Follow-Up: Call back or seek an in-person evaluation if the symptoms worsen or if the condition fails to improve as  anticipated.  [facility] [phone]  Other Instructions Please test for COVID as discussed and let us know ASAP if positive.   Common Medications Safe in Pregnancy  Acne:      Constipation:  Benzoyl Peroxide     Colace  Clindamycin      Dulcolax Suppository  Topica Erythromycin     Fibercon  Salicylic Acid      Metamucil         Miralax AVOID:        Senakot   Accutane    Cough:  Retin-A       Cough Drops  Tetracycline      Phenergan w/ Codeine if Rx  Minocycline      Robitussin (Plain & DM)  Antibiotics:     Crabs/Lice:  Ceclor       RID  Cephalosporins    AVOID:  E-Mycins      Kwell  Keflex  Macrobid/Macrodantin   Diarrhea:  Penicillin      Kao-Pectate  Zithromax      Imodium AD         PUSH FLUIDS AVOID:       Cipro     Fever:  Tetracycline      Tylenol (Regular or Extra  Minocycline       Strength)  Levaquin      Extra Strength-Do not          Exceed 8 tabs/24 hrs Caffeine:        200mg /day (equiv. To 1 cup of  coffee or  approx. 3 12 oz sodas)         Gas: Cold/Hayfever:       Gas-X  Benadryl      Mylicon  Claritin       Phazyme  **Claritin-D        Chlor-Trimeton    Headaches:  Dimetapp      ASA-Free Excedrin  Drixoral-Non-Drowsy     Cold Compress  Mucinex (Guaifenasin)     Tylenol (Regular or Extra  Sudafed/Sudafed-12 Hour     Strength)  **Sudafed PE Pseudoephedrine   Tylenol Cold & Sinus     Vicks Vapor Rub  Zyrtec  **AVOID if Problems With Blood Pressure         Heartburn: Avoid lying down for at least 1 hour after meals  Aciphex      Maalox     Rash:  Milk of Magnesia     Benadryl    Mylanta       1% Hydrocortisone Cream  Pepcid  Pepcid Complete   Sleep Aids:  Prevacid      Ambien   Prilosec       Benadryl  Rolaids       Chamomile Tea  Tums (Limit 4/day)     Unisom         Tylenol PM         Warm milk-add vanilla or  Hemorrhoids:       Sugar for taste  Anusol/Anusol H.C.  (RX: Analapram 2.5%)  Sugar  Substitutes:  Hydrocortisone OTC     Ok in moderation  Preparation H      Tucks        Vaseline lotion applied to tissue with wiping    Herpes:     Throat:  Acyclovir      Oragel  Famvir  Valtrex     Vaccines:         Flu Shot Leg Cramps:       *Gardasil  Benadryl      Hepatitis A         Hepatitis B Nasal Spray:       Pneumovax  Saline Nasal Spray     Polio Booster         Tetanus Nausea:       Tuberculosis test or PPD  Vitamin B6 25 mg TID   AVOID:    Dramamine      *Gardasil  Emetrol       Live Poliovirus  Ginger Root 250 mg QID    MMR (measles, mumps &  High Complex Carbs @ Bedtime    rebella)  Sea Bands-Accupressure    Varicella (Chickenpox)  Unisom 1/2 tab TID     *No known complications           If received before Pain:         Known pregnancy;   Darvocet       Resume series after  Lortab        Delivery  Percocet    Yeast:   Tramadol      Femstat  Tylenol 3      Gyne-lotrimin  Ultram       Monistat  Vicodin           MISC:         All Sunscreens           Hair Coloring/highlights          Insect Repellant's          (  Including DEET)         Mystic Tans    If you have been instructed to have an in-person evaluation today at a local Urgent Care facility, please use the link below. It will take you to a list of all of our available [facility], including address, phone number and hours of operation. Please do not delay care.  [facility]  If you or a family member do not have a primary care provider, use the link below to schedule a visit and establish care. When you choose a [facility] primary care physician or advanced practice provider, you gain a long-term partner in health. Find a Primary Care Provider  Learn more about [facility]'s in-office and virtual care options: [facility] - Get Care Now

## 2023-05-16 NOTE — Progress Notes (Signed)
Virtual Visit Consent   [name], you are scheduled for a virtual visit with a [facility] provider today. Just as with appointments in the office, your consent must be obtained to participate. Your consent will be active for this visit and any virtual visit you may have with one of our providers in the next 365 days. If you have a MyChart account, a copy of this consent can be sent to you electronically.  As this is a virtual visit, video technology does not allow for your provider to perform a traditional examination. This may limit your provider's ability to fully assess your condition. If your provider identifies any concerns that need to be evaluated in person or the need to arrange testing (such as labs, EKG, etc.), we will make arrangements to do so. Although advances in technology are sophisticated, we cannot ensure that it will always work on either your end or our end. If the connection with a video visit is poor, the visit may have to be switched to a telephone visit. With either a video or telephone visit, we are not always able to ensure that we have a secure connection.  By engaging in this virtual visit, you consent to the provision of healthcare and authorize for your insurance to be billed (if applicable) for the services provided during this visit. Depending on your insurance coverage, you may receive a charge related to this service.  I need to obtain your verbal consent now. Are you willing to proceed with your visit today? Angela Shepard has provided verbal consent on 05/16/2023 for a virtual visit (video or telephone). Angela Shepard, [place]  Date: 05/16/2023 5:08 PM  Virtual Visit via Video Note   I, Angela Shepard, connected with  Angela Shepard  ([id number], 20-Apr-1996) on 05/16/23 at  5:00 PM EST by a video-enabled telemedicine application and verified that I am speaking with the correct person using two identifiers.  Location: Patient: Virtual Visit  Location Patient: Home Provider: Virtual Visit Location Provider: Home Office   I discussed the limitations of evaluation and management by telemedicine and the availability of in person appointments. The patient expressed understanding and agreed to proceed.    History of Present Illness: Angela Shepard is a 27 y.o. who identifies as a female who was assigned female at birth, and is being seen today for 1 day of headache and nasal congestion and irritation starting today. Denies fever, chills or body aches. Notes feeling slightly tired. OTC -- Tylenol. Things running through the office right now including multiple people with walking pneumonia.  HPI: HPI  Problems:  Patient Active Problem List   Diagnosis Date Noted   Supervision of other normal pregnancy, antepartum 04/04/2023   Abdominal cramps 10/25/2022   Impaired fasting glucose 09/22/2022   Hyperlipidemia LDL goal <100 09/22/2022   Vitamin D deficiency 09/22/2022   Other fatigue 09/22/2022   Irritable bowel syndrome with diarrhea 05/16/2021   Generalized anxiety disorder 05/16/2021   Insomnia due to anxiety and fear 05/16/2021   Body mass index 26.0-26.9, adult 05/16/2021   Chronic migraine without aura without status migrainosus, not intractable 11/08/2018    Allergies:  Allergies  Allergen Reactions   Amoxicillin    Penicillins Rash    Did it involve swelling of the face/tongue/throat, SOB, or low BP? Unknown Did it involve sudden or severe rash/hives, skin peeling, or any reaction on the inside of your mouth or nose? Unknown Did you need to seek medical  attention at a hospital or doctor's office? Unknown When did it last happen?    Childhood   If all above answers are "NO", may proceed with cephalosporin use.   Medications:  Current Outpatient Medications:    COVID-19 At Home Antigen Test KIT, Use as directed., Disp: 1 kit, Rfl: 0   Prenatal Vit-Fe Fumarate-FA (PRENATAL VITAMIN PO), Take 1 tablet by mouth daily.,  Disp: , Rfl:   Observations/Objective: Patient is well-developed, well-nourished in no acute distress.  Resting comfortably at home.  Head is normocephalic, atraumatic.  No labored breathing. Speech is clear and coherent with logical content.  Patient is alert and oriented at baseline.   Assessment and Plan: 1. Viral URI with cough - COVID-19 At Home Antigen Test KIT; Use as directed.  Dispense: 1 kit; Refill: 0  1 day of symptoms. Afebrile. Does not seem consistent with influenza. Giving pregnancy status recommended COVID testing as a precautions. She is to let us know ASAP if positive. Otherwise she is to continue recommendation given at time of visit. Supportive measures and pregnancy-safe OTC medications reviewed. Work note provided.  Follow Up Instructions: I discussed the assessment and treatment plan with the patient. The patient was provided an opportunity to ask questions and all were answered. The patient agreed with the plan and demonstrated an understanding of the instructions.  A copy of instructions were sent to the patient via MyChart unless otherwise noted below.   The patient was advised to call back or seek an in-person evaluation if the symptoms worsen or if the condition fails to improve as anticipated.    Angela Climes, PA-C

## 2023-05-17 ENCOUNTER — Encounter: Payer: Self-pay | Admitting: *Deleted

## 2023-05-24 ENCOUNTER — Ambulatory Visit: Payer: Medicaid Other | Attending: Obstetrics and Gynecology

## 2023-05-24 ENCOUNTER — Other Ambulatory Visit: Payer: Self-pay

## 2023-05-24 ENCOUNTER — Ambulatory Visit: Payer: Medicaid Other | Admitting: *Deleted

## 2023-05-24 ENCOUNTER — Other Ambulatory Visit: Payer: Self-pay | Admitting: *Deleted

## 2023-05-24 VITALS — BP 122/64 | HR 80

## 2023-05-24 DIAGNOSIS — Z3480 Encounter for supervision of other normal pregnancy, unspecified trimester: Secondary | ICD-10-CM | POA: Diagnosis not present

## 2023-05-24 DIAGNOSIS — O321XX0 Maternal care for breech presentation, not applicable or unspecified: Secondary | ICD-10-CM | POA: Insufficient documentation

## 2023-05-24 DIAGNOSIS — Z363 Encounter for antenatal screening for malformations: Secondary | ICD-10-CM | POA: Diagnosis present

## 2023-05-24 DIAGNOSIS — Z362 Encounter for other antenatal screening follow-up: Secondary | ICD-10-CM

## 2023-05-24 DIAGNOSIS — Z3A18 18 weeks gestation of pregnancy: Secondary | ICD-10-CM | POA: Diagnosis not present

## 2023-05-25 ENCOUNTER — Telehealth: Payer: Medicaid Other | Admitting: Physician Assistant

## 2023-05-25 ENCOUNTER — Ambulatory Visit (INDEPENDENT_AMBULATORY_CARE_PROVIDER_SITE_OTHER): Payer: Medicaid Other | Admitting: Family Medicine

## 2023-05-25 VITALS — BP 113/69 | HR 94 | Wt 147.9 lb

## 2023-05-25 DIAGNOSIS — K0889 Other specified disorders of teeth and supporting structures: Secondary | ICD-10-CM | POA: Diagnosis not present

## 2023-05-25 DIAGNOSIS — R7301 Impaired fasting glucose: Secondary | ICD-10-CM

## 2023-05-25 DIAGNOSIS — F411 Generalized anxiety disorder: Secondary | ICD-10-CM

## 2023-05-25 DIAGNOSIS — K580 Irritable bowel syndrome with diarrhea: Secondary | ICD-10-CM

## 2023-05-25 DIAGNOSIS — Z3480 Encounter for supervision of other normal pregnancy, unspecified trimester: Secondary | ICD-10-CM

## 2023-05-25 DIAGNOSIS — G43709 Chronic migraine without aura, not intractable, without status migrainosus: Secondary | ICD-10-CM

## 2023-05-25 DIAGNOSIS — Z3A19 19 weeks gestation of pregnancy: Secondary | ICD-10-CM

## 2023-05-25 MED ORDER — CEFDINIR 300 MG PO CAPS: 300.0000 mg | ORAL_CAPSULE | Freq: Two times a day (BID) | ORAL | 0 refills | Status: DC

## 2023-05-25 MED ORDER — CHLORHEXIDINE GLUCONATE 0.12 % MT SOLN: 15.0000 mL | Freq: Two times a day (BID) | OROMUCOSAL | 0 refills | Status: DC

## 2023-05-25 MED ORDER — METRONIDAZOLE 500 MG PO TABS: 500.0000 mg | ORAL_TABLET | Freq: Two times a day (BID) | ORAL | 0 refills | Status: DC

## 2023-05-25 NOTE — Patient Instructions (Signed)
Angela Shepard, thank you for joining [name], [name]  While this provider is not your primary care provider (PCP), if your PCP is located in our provider database this encounter information will be shared with them immediately following your visit.   A [facility] MyChart account gives you access to today's visit and all your visits, tests, and labs performed at [facility] " click here if you don't have a [facility] MyChart account or go to [email or web address]  Consent: (Patient) Angela Shepard provided verbal consent for this virtual visit at the beginning of the encounter.  Current Medications:  Current Outpatient Medications:    COVID-19 At Home Antigen Test KIT, Use as directed., Disp: 1 kit, Rfl: 0   Prenatal Vit-Fe Fumarate-FA (PRENATAL VITAMIN PO), Take 1 tablet by mouth daily., Disp: , Rfl:    Medications ordered in this encounter:  No orders of the defined types were placed in this encounter.    *If you need refills on other medications prior to your next appointment, please contact your pharmacy*  Follow-Up: Call back or seek an in-person evaluation if the symptoms worsen or if the condition fails to improve as anticipated.  [facility] [phone]  Other Instructions Dental Pain Dental pain is often a sign that something is wrong with your teeth or gums. You can also have pain after a dental treatment. If you have dental pain, it is important to contact your dentist, especially if the cause of the pain is not known. Dental pain may hurt a lot or a little and can be caused by many things, including: Tooth decay (cavities or caries). Infection. The inner part of the tooth being filled with pus (an abscess). Injury. A crack in the tooth. Gums that move back and expose the root of a tooth. Gum disease. Abnormal grinding or clenching of teeth. Not taking good care of your teeth. Sometimes the  cause of pain is not known. You may have pain all the time, or it may happen only when you are: Chewing. Exposed to hot or cold temperatures. Eating or drinking foods or drinks that have a lot of sugar in them, such as soda or candy. Follow these instructions at home: Medicines Take over-the-counter and prescription medicines only as told by your dentist. If you were prescribed an antibiotic medicine, take it as told by your dentist. Do not stop taking it even if you start to feel better. Eating and drinking Do not eat foods or drinks that cause you pain. These include: Very hot or very cold foods or drinks. Sweet or sugary foods or drinks. Managing pain and swelling  If told, put ice on the painful area of your face. To do this: Put ice in a plastic bag. Place a towel between your skin and the bag. Leave the ice on for 20 minutes, 2-3 times a day. Take off the ice if your skin turns bright red. This is very important. If you cannot feel pain, heat, or cold, you have a greater risk of damage to the area. Brushing your teeth Brush your teeth twice a day using a fluoride toothpaste. Use a toothpaste made for sensitive teeth as told by your dentist. Use a soft toothbrush. General instructions Floss your teeth at least once a day. Do not put heat on the outside of your face. Rinse your mouth often with salt water. To make salt water, dissolve -1 tsp (3-6 g) of salt in  1 cup (237 mL) of warm water. Watch your dental pain. Let your dentist know if there are any changes. Keep all follow-up visits. Contact a dentist if: You have dental pain and you do not know why. Medicine does not help your pain. Your symptoms get worse. You have new symptoms. Get help right away if: You cannot open your mouth. You are having trouble breathing or swallowing. You have a fever. Your face, neck, or jaw is swollen. These symptoms may be an emergency. Get help right away. Call your local emergency  services (911 in the [place] Do not wait to see if the symptoms will go away. Do not drive yourself to the hospital. Summary Dental pain may be caused by many things, including tooth decay, injury, or infection. In some cases, the cause is not known. Dental pain may hurt a lot or very little. You may have pain all the time, or you may have it only when you eat or drink. Take over-the-counter and prescription medicines only as told by your dentist. Watch your dental pain for any changes. Let your dentist know if symptoms get worse. This information is not intended to replace advice given to you by your health care provider. Make sure you discuss any questions you have with your health care provider. Document Revised: 03/04/2020 Document Reviewed: 03/04/2020 Elsevier Patient Education  2024 [employer]vier Inc.    If you have been instructed to have an in-person evaluation today at a local Urgent Care facility, please use the link below. It will take you to a list of all of our available [facility], including address, phone number and hours of operation. Please do not delay care.  [facility]  If you or a family member do not have a primary care provider, use the link below to schedule a visit and establish care. When you choose a [facility] primary care physician or advanced practice provider, you gain a long-term partner in health. Find a Primary Care Provider  Learn more about [facility]'s in-office and virtual care options: [facility] - Get Care Now

## 2023-05-25 NOTE — Progress Notes (Signed)
Virtual Visit Consent   Angela Shepard, you are scheduled for a virtual visit with a [facility] provider today. Just as with appointments in the office, your consent must be obtained to participate. Your consent will be active for this visit and any virtual visit you may have with one of our providers in the next 365 days. If you have a MyChart account, a copy of this consent can be sent to you electronically.  As this is a virtual visit, video technology does not allow for your provider to perform a traditional examination. This may limit your provider's ability to fully assess your condition. If your provider identifies any concerns that need to be evaluated in person or the need to arrange testing (such as labs, EKG, etc.), we will make arrangements to do so. Although advances in technology are sophisticated, we cannot ensure that it will always work on either your end or our end. If the connection with a video visit is poor, the visit may have to be switched to a telephone visit. With either a video or telephone visit, we are not always able to ensure that we have a secure connection.  By engaging in this virtual visit, you consent to the provision of healthcare and authorize for your insurance to be billed (if applicable) for the services provided during this visit. Depending on your insurance coverage, you may receive a charge related to this service.  I need to obtain your verbal consent now. Are you willing to proceed with your visit today? [name] has provided verbal consent on 05/25/2023 for a virtual visit (video or telephone). Angela Shepard, [place]  Date: 05/25/2023 9:54 AM  Virtual Visit via Video Note   I, Angela Shepard, connected with  Angela Shepard  ([id number], Feb 09, 1996) on 05/25/23 at  9:15 AM EST by a video-enabled telemedicine application and verified that I am speaking with the correct person using two identifiers.  Location: Patient: Virtual Visit  Location Patient: Home Provider: Virtual Visit Location Provider: Home Office   I discussed the limitations of evaluation and management by telemedicine and the availability of in person appointments. The patient expressed understanding and agreed to proceed.    History of Present Illness: Angela Shepard is a 27 y.o. who identifies as a female who was assigned female at birth, and is being seen today for dental pain.  Patient is currently [redacted] wks pregnant.   Patient endorse symptom onset 4 days ago with substantial pain of her R bottom wisdom tooth. Notes she has been to dental provider for this before and told that all four of wisdom teeth impacted. Has not been able to get removed due to insurance at present. Has intermittent flare ups of pain and/or infection. Dental practice she previously seen is currently closed for remodeling. Denies fever, chills. Does not feel there is infection present currently. Taking Tylenol with only some relief in pain.   HPI: HPI  Problems:  Patient Active Problem List   Diagnosis Date Noted   Supervision of other normal pregnancy, antepartum 04/04/2023   Impaired fasting glucose 09/22/2022   Vitamin D deficiency 09/22/2022   Irritable bowel syndrome with diarrhea 05/16/2021   Generalized anxiety disorder 05/16/2021   Chronic migraine without aura without status migrainosus, not intractable 11/08/2018    Allergies:  Allergies  Allergen Reactions   Amoxicillin    Penicillins Rash    Did it involve swelling of the face/tongue/throat, SOB, or low BP? Unknown Did it  involve sudden or severe rash/hives, skin peeling, or any reaction on the inside of your mouth or nose? Unknown Did you need to seek medical attention at a hospital or doctor's office? Unknown When did it last happen?    Childhood   If all above answers are "NO", may proceed with cephalosporin use.   Medications:  Current Outpatient Medications:    chlorhexidine (PERIDEX) 0.12 % solution,  Use as directed 15 mLs in the mouth or throat 2 (two) times daily., Disp: 120 mL, Rfl: 0   COVID-19 At Home Antigen Test KIT, Use as directed., Disp: 1 kit, Rfl: 0   Prenatal Vit-Fe Fumarate-FA (PRENATAL VITAMIN PO), Take 1 tablet by mouth daily., Disp: , Rfl:   Observations/Objective: Patient is well-developed, well-nourished in no acute distress.  Resting comfortably  at home.  Head is normocephalic, atraumatic.  No labored breathing.  Speech is clear and coherent with logical content.  Patient is alert and oriented at baseline.   Assessment and Plan: 1. Pain, dental (Primary) - chlorhexidine (PERIDEX) 0.12 % solution; Use as directed 15 mLs in the mouth or throat 2 (two) times daily.  Dispense: 120 mL; Refill: 0  Limited options via telehealth in pregnancy. Can use Tylenol OTC. Discussed use of clove paste to the area. Peridex solution. She does have appt with her OB later today who may be able to prescribe additional pain relieving medications during her pregnancy.   Follow Up Instructions: I discussed the assessment and treatment plan with the patient. The patient was provided an opportunity to ask questions and all were answered. The patient agreed with the plan and demonstrated an understanding of the instructions.  A copy of instructions were sent to the patient via MyChart unless otherwise noted below.   The patient was advised to call back or seek an in-person evaluation if the symptoms worsen or if the condition fails to improve as anticipated.    Angela Climes, PA-C

## 2023-05-25 NOTE — Progress Notes (Signed)
For the safety of you and your child, I recommend a face to face office visit with a health care provider.  Many mothers need to take medicines during their pregnancy and while nursing.  Almost all medicines pass into the breast milk in small quantities.  Most are generally considered safe for a mother to take but some medicines must be avoided.  After reviewing your E-Visit request, I recommend that you consult your OB/GYN  for medical advice in relation to your condition and prescription medications while pregnant or breastfeeding. The standard of therapy is Augmentin which contains Amoxicillin that you are allergic to. The other 1st and 2nd line medications are not the greatest choices in pregnancy, so your OBGYN will be able to best navigate this to make sure you are put on an appropriate therapy that is safe for your current pregnancy.  NOTE:  There will be NO CHARGE for this eVisit  If you are having a true medical emergency please call 911.    For an urgent face to face visit, [facility] has six urgent care centers for your convenience:     [facility] at [facility] Directions [phone] [address] [place], [place] [address]    [facility] Urgent Care Center [facility]) Get Driving Directions [phone] [address] [place], [place] [address]  [facility] Ur[facility] Care Center [facility]) Get Driving Directions [phone] [address] [place],  [place]  [address]  [facility] Urgent Care at [facility] Get Driving Directions [phone] [address] [place], [place] [address]   [facility] Urgent Care at [facility] Get Driving Directions  [phone] [address] [place], [place] [address]   [facility] Urgent Care at [facility] Directions [phone] [address], [address] [place], [place] [address]  Your MyChart E-visit  questionnaire answers were reviewed by a board certified advanced clinical practitioner to complete your personal care plan based on your specific symptoms.  Thank you for using e-Visits.

## 2023-05-25 NOTE — Progress Notes (Signed)
   PRENATAL VISIT NOTE  Subjective:  Angela Shepard is a 27 y.o. G1P0000 at [redacted]w[redacted]d being seen today for ongoing prenatal care.  She is currently monitored for the following issues for this low-risk pregnancy and has Chronic migraine without aura without status migrainosus, not intractable; Irritable bowel syndrome with diarrhea; Generalized anxiety disorder; Impaired fasting glucose; Vitamin D deficiency; and Supervision of other normal pregnancy, antepartum on their problem list.  Patient doing well with no acute concerns today. She reports  tooth ache 2/2 wisdom teeth with little relief with tylenol 500mg  Q6hrs PRN and OTC oralgel.  Spoke with PCP today who sent in Peridex mouth wash solution, has not picked up yet, denies F/C .  Contractions: Not present. Vag. Bleeding: None.  Movement: Absent. Denies leaking of fluid.   The following portions of the patient's history were reviewed and updated as appropriate: allergies, current medications, past family history, past medical history, past social history, past surgical history and problem list. Problem list updated.  Objective:   Vitals:   05/25/23 1526  BP: 113/69  Pulse: 94  Weight: 147 lb 14.4 oz (67.1 kg)    Fetal Status: Fetal Heart Rate (bpm): 151   Movement: Absent     General:  Alert, oriented and cooperative. Patient is in no acute distress.  Skin: Skin is warm and dry. No rash noted.   Cardiovascular: Normal heart rate noted  Respiratory: Normal respiratory effort, no problems with respiration noted  Abdomen: Soft, gravid, appropriate for gestational age.  Pain/Pressure: Absent     Pelvic: Cervical exam deferred        Extremities: Normal range of motion.  Edema: None  Mental Status:  Normal mood and affect. Normal behavior. Normal judgment and thought content.   Assessment and Plan:  Pregnancy: G1P0000 at [redacted]w[redacted]d  1. [redacted] weeks gestation of pregnancy (Primary) 2. 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 Generalized anxiety disorder - stable  4. Chronic migraine without aura without status migrainosus, not intractable - stable   5. Irritable bowel syndrome with diarrhea - stable  6. Impaired fasting glucose - ordered A1c today   7. Tooth ache - delayed prescribing of Cefdinir + Metronidazole given reported allergies to penicillins (rash only) and minor swelling of bilateral inferior gums atop apparent wisdom teeth concerning for possible gingivitis vs periodontitis  - patient understanding not to fill / start these antibiotics unless develops fever or condition worsens despite use of mouth wash, tylenol 1g TID and oralgel with hot/cool packs PRN.    Preterm labor symptoms and general obstetric precautions including but not limited to vaginal bleeding, contractions, leaking of fluid and fetal movement were reviewed in detail with the patient.  Please refer to After Visit Summary for other counseling recommendations.   Return in about 4 weeks (around 06/22/2023) for ROB.  Mittie Bodo, MD Family Medicine - Obstetrics Fellow

## 2023-05-26 LAB — HEMOGLOBIN A1C
Est. average glucose Bld gHb Est-mCnc: 100 mg/dL
Hgb A1c MFr Bld: 5.1 % (ref 4.8–5.6)

## 2023-05-27 LAB — AFP, SERUM, OPEN SPINA BIFIDA
AFP MoM: 1.48
AFP Value: 73.5 ng/mL
Gest. Age on Collection Date: 19 wk
Maternal Age At EDD: 27.7 a
OSBR Risk 1 IN: 2886
Test Results:: NEGATIVE
Weight: 147 [lb_av]

## 2023-06-14 NOTE — L&D Delivery Note (Signed)
 OB/GYN Faculty Practice Delivery Note  Angela Shepard is a 28 y.o. G1P0000 s/p vag delivery at [redacted]w[redacted]d. She was admitted for SROM/SOL.   ROM: 13h 41m with clear fluid GBS Status: neg Maximum Maternal Temperature: 99.5  Labor Progress: [name] was admitted with SROM/SOL; she progressed spontaneously to complete after her epidural was placed, and then had some 2nd stage Pit to increase ctx strength; she pushed for an hour to vag delivery.  Delivery Date/Time: April 27th, 2025 at 1444 Delivery: Called to room and patient was complete and pushing. Head delivered LOA. No nuchal cord present, however the cord was subjectively short. Shoulder and body delivered in usual fashion. Infant with spontaneous cry, placed on mother's lower abdomen, dried and stimulated. Cord clamped x 2 after 1-minute delay, and cut by FOB. Cord blood drawn. Placenta delivered spontaneously with gentle cord traction. Fundus firm with massage and Pitocin. Labia, perineum, vagina, and cervix inspected and found to have a small lac just inside the vagina.   Placenta: spont, intact; to L&D Complications: none Lacerations: sm vag lac repaired with one throw, 3-0 Vicryl EBL: 239cc Analgesia: epidural  Postpartum Planning [x]  message to sent to schedule follow-up, including 1wk BP check due to elevations upon arrival to MAU [x]  fasting blood glucose for 4/28 ordered  Infant: boy  APGARs 9/9  2690g (5lb 14.9oz)  Jolayne Natter, CNM  10/08/2023 3:14 PM

## 2023-06-19 ENCOUNTER — Other Ambulatory Visit: Payer: Medicaid Other

## 2023-06-22 ENCOUNTER — Ambulatory Visit: Payer: Medicaid Other | Attending: Maternal & Fetal Medicine

## 2023-06-22 ENCOUNTER — Ambulatory Visit (INDEPENDENT_AMBULATORY_CARE_PROVIDER_SITE_OTHER): Payer: Medicaid Other | Admitting: Obstetrics and Gynecology

## 2023-06-22 ENCOUNTER — Other Ambulatory Visit: Payer: Self-pay

## 2023-06-22 VITALS — BP 108/71 | HR 85 | Wt 159.0 lb

## 2023-06-22 DIAGNOSIS — Z362 Encounter for other antenatal screening follow-up: Secondary | ICD-10-CM | POA: Diagnosis present

## 2023-06-22 DIAGNOSIS — F411 Generalized anxiety disorder: Secondary | ICD-10-CM

## 2023-06-22 DIAGNOSIS — O23592 Infection of other part of genital tract in pregnancy, second trimester: Secondary | ICD-10-CM

## 2023-06-22 DIAGNOSIS — Z3A22 22 weeks gestation of pregnancy: Secondary | ICD-10-CM | POA: Diagnosis not present

## 2023-06-22 DIAGNOSIS — Z3A23 23 weeks gestation of pregnancy: Secondary | ICD-10-CM

## 2023-06-22 DIAGNOSIS — R87610 Atypical squamous cells of undetermined significance on cytologic smear of cervix (ASC-US): Secondary | ICD-10-CM

## 2023-06-22 DIAGNOSIS — G43709 Chronic migraine without aura, not intractable, without status migrainosus: Secondary | ICD-10-CM

## 2023-06-22 DIAGNOSIS — O358XX0 Maternal care for other (suspected) fetal abnormality and damage, not applicable or unspecified: Secondary | ICD-10-CM

## 2023-06-22 DIAGNOSIS — R7301 Impaired fasting glucose: Secondary | ICD-10-CM

## 2023-06-22 DIAGNOSIS — Z3480 Encounter for supervision of other normal pregnancy, unspecified trimester: Secondary | ICD-10-CM

## 2023-06-22 DIAGNOSIS — O99342 Other mental disorders complicating pregnancy, second trimester: Secondary | ICD-10-CM

## 2023-06-22 NOTE — Progress Notes (Signed)
   PRENATAL VISIT NOTE  Subjective:  Angela Shepard is a 28 y.o. G1P0000 at [redacted]w[redacted]d being seen today for ongoing prenatal care.  She is currently monitored for the following issues for this low-risk pregnancy and has Chronic migraine without aura without status migrainosus, not intractable; Irritable bowel syndrome with diarrhea; Generalized anxiety disorder; Impaired fasting glucose; Vitamin D  deficiency; and Supervision of other normal pregnancy, antepartum on their problem list.  Patient reports  doing well overall. Some brief ctx at night. . Constipation. Contractions: Irritability. Vag. Bleeding: None.  Movement: Present. Denies leaking of fluid.   The following portions of the patient's history were reviewed and updated as appropriate: allergies, current medications, past family history, past medical history, past social history, past surgical history and problem list.   Objective:   Vitals:   06/22/23 1347  BP: 108/71  Pulse: 85  Weight: 159 lb (72.1 kg)    Fetal Status: Fetal Heart Rate (bpm): 148   Movement: Present     General:  Alert, oriented and cooperative. Patient is in no acute distress.  Skin: Skin is warm and dry. No rash noted.   Cardiovascular: Normal heart rate noted  Respiratory: Normal respiratory effort, no problems with respiration noted  Abdomen: Soft, gravid, appropriate for gestational age.  Pain/Pressure: Present      Assessment and Plan:  Pregnancy: G1P0000 at [redacted]w[redacted]d 1. Supervision of other normal pregnancy, antepartum (Primary) 2. [redacted] weeks gestation of pregnancy Anatomy US  incomplete, f/u scheduled for this afternoon Discussed 2h GTT, CBC/HIV/RPR, and tdap next appt Discussed cone healthy baby website and prenatal classes  3. Impaired fasting glucose A1c 5.1 Routine 2h next appt  4. Generalized anxiety disorder Mood stable  5. Chronic migraine without aura without status migrainosus, not intractable Less frequent, no complaints today  6.  Atypical squamous cells of undetermined significance (ASCUS) on Papanicolaou smear of cervix HPV neg. Next pap due 09/2025  Please refer to After Visit Summary for other counseling recommendations.   Return in about 4 weeks (around 07/20/2023) for return OB at 27w with 2h GTT, CBC, HIV/RPR and tdap.  Future Appointments  Date Time Provider Department Center  06/22/2023  3:30 PM [facility] US5 [facility]  09/25/2023  9:10 AM Wallace Joesph LABOR, PA [facility] None   Kieth JAYSON Carolin, MD

## 2023-07-14 ENCOUNTER — Telehealth: Payer: Self-pay

## 2023-07-14 NOTE — Telephone Encounter (Signed)
 Returned call, no answer.

## 2023-07-19 ENCOUNTER — Encounter: Payer: Self-pay | Admitting: Physician Assistant

## 2023-07-19 ENCOUNTER — Telehealth: Payer: Medicaid Other | Admitting: Physician Assistant

## 2023-07-19 ENCOUNTER — Ambulatory Visit: Payer: Self-pay | Admitting: Family Medicine

## 2023-07-19 DIAGNOSIS — J069 Acute upper respiratory infection, unspecified: Secondary | ICD-10-CM | POA: Diagnosis not present

## 2023-07-19 NOTE — Progress Notes (Signed)
 Virtual Visit Consent   Angela Shepard, you are scheduled for a virtual visit with a [facility] provider today. Just as with appointments in the office, your consent must be obtained to participate. Your consent will be active for this visit and any virtual visit you may have with one of our providers in the next 365 days. If you have a MyChart account, a copy of this consent can be sent to you electronically.  As this is a virtual visit, video technology does not allow for your provider to perform a traditional examination. This may limit your provider's ability to fully assess your condition. If your provider identifies any concerns that need to be evaluated in person or the need to arrange testing (such as labs, EKG, etc.), we will make arrangements to do so. Although advances in technology are sophisticated, we cannot ensure that it will always work on either your end or our end. If the connection with a video visit is poor, the visit may have to be switched to a telephone visit. With either a video or telephone visit, we are not always able to ensure that we have a secure connection.  By engaging in this virtual visit, you consent to the provision of healthcare and authorize for your insurance to be billed (if applicable) for the services provided during this visit. Depending on your insurance coverage, you may receive a charge related to this service.  I need to obtain your verbal consent now. Are you willing to proceed with your visit today? Angela Shepard has provided verbal consent on 07/19/2023 for a virtual visit (video or telephone). Lovette Borg, PA-C  Date: 07/19/2023 7:45 PM  Virtual Visit via Video Note   I, Ellice Boultinghouse, connected with  Angela Shepard  ([id number], [birth year]) on 07/19/23 at  7:45 PM EST by a video-enabled telemedicine application and verified that I am speaking with the correct person using two identifiers.  Location: Patient: Virtual Visit Location Patient:  Home Provider: Virtual Visit Location Provider: Home Office   I discussed the limitations of evaluation and management by telemedicine and the availability of in person appointments. The patient expressed understanding and agreed to proceed.    History of Present Illness: Angela Shepard is a 28 y.o. who identifies as a female who was assigned female at birth, and is being seen today for uri symptoms.  HPI: 28 y/o F 7 mo. Pregnant F presents via video visit for nasal congestion, sore throat, post nasal drainage, and sinus pressure x 2 days. No fever. Minimal cough. Has been taking otc Tylenol  and using Flonase . No complications from pregnancy so far.   URI     Problems:  Patient Active Problem List   Diagnosis Date Noted   Supervision of other normal pregnancy, antepartum 04/04/2023   Impaired fasting glucose 09/22/2022   Vitamin D  deficiency 09/22/2022   Irritable bowel syndrome with diarrhea 05/16/2021   Generalized anxiety disorder 05/16/2021   Chronic migraine without aura without status migrainosus, not intractable 11/08/2018    Allergies:  Allergies  Allergen Reactions   Amoxicillin    Penicillins Rash    Did it involve swelling of the face/tongue/throat, SOB, or low BP? Unknown Did it involve sudden or severe rash/hives, skin peeling, or any reaction on the inside of your mouth or nose? Unknown Did you need to seek medical attention at a hospital or doctor's office? Unknown When did it last happen?    Childhood   If all above answers  are "NO", may proceed with cephalosporin use.   Medications:  Current Outpatient Medications:    cefdinir  (OMNICEF ) 300 MG capsule, Take 1 capsule (300 mg total) by mouth 2 (two) times daily., Disp: 14 capsule, Rfl: 0   chlorhexidine  (PERIDEX ) 0.12 % solution, Use as directed 15 mLs in the mouth or throat 2 (two) times daily., Disp: 120 mL, Rfl: 0   metroNIDAZOLE  (FLAGYL ) 500 MG tablet, Take 1 tablet (500 mg total) by mouth 2 (two) times  daily. (Patient not taking: Reported on 06/22/2023), Disp: 14 tablet, Rfl: 0   Prenatal Vit-Fe Fumarate-FA (PRENATAL VITAMIN PO), Take 1 tablet by mouth daily., Disp: , Rfl:   Observations/Objective: Patient is well-developed, well-nourished in no acute distress.  Resting comfortably  at home.  Head is normocephalic, atraumatic.  No labored breathing.  Speech is clear and coherent with logical content.  Patient is alert and oriented at baseline.    Assessment and Plan: 1. Viral upper respiratory tract infection (Primary)  Stay well hydrated with clear liquids May continue with Flonase  and Tylenol . Continue with humidifier Steam from warm shower will help with nasal congestion Continue to watch for worsening symptoms. Contact OB if symptoms don't improve or worsen. Pt verbalized understanding and in agreement.    Follow Up Instructions: I discussed the assessment and treatment plan with the patient. The patient was provided an opportunity to ask questions and all were answered. The patient agreed with the plan and demonstrated an understanding of the instructions.  A copy of instructions were sent to the patient via MyChart unless otherwise noted below.   Patient has requested to receive PHI (AVS, Work Notes, etc) pertaining to this video visit through e-mail as they are currently without active MyChart. They have voiced understand that email is not considered secure and their health information could be viewed by someone other than the patient.   The patient was advised to call back or seek an in-person evaluation if the symptoms worsen or if the condition fails to improve as anticipated.    Ingri Diemer, PA-C

## 2023-07-19 NOTE — Telephone Encounter (Signed)
  Reason for Disposition  [1] Using nasal washes and pain medicine > 24 hours AND [2] sinus pain (around cheekbone or eye) persists  Answer Assessment - Initial Assessment Questions 1. ONSET: When did the throat start hurting? (Hours or days ago)      2 days ago  2. SEVERITY: How bad is the sore throat? (Scale 1-10; mild, moderate or severe)   - MILD (1-3):  Doesn't interfere with eating or normal activities.   - MODERATE (4-7): Interferes with eating some solids and normal activities.   - SEVERE (8-10):  Excruciating pain, interferes with most normal activities.   - SEVERE WITH DYSPHAGIA (10): Can't swallow liquids, drooling.     Mild  3.  VIRAL SYMPTOMS: Are there any symptoms of a cold, such as a runny nose, cough, hoarse voice or red eyes?      Cough, stuffy nose  4. FEVER: Do you have a fever? If Yes, ask: What is your temperature, how was it measured, and when did it start?     No   5. OTHER SYMPTOMS: Do you have any other symptoms? (e.g., difficulty breathing, headache, rash)     Cough, congestion, night sweats, having sinus pain was bad this morning (a little better with OTC med)  6. PREGNANCY: Is there any chance you are pregnant? When was your last menstrual period?     Patient is pregnant  Answer Assessment - Initial Assessment Questions 1. ONSET: When did the sinus pain start?  (e.g., hours, days)      A couple days ago  2. SEVERITY: How bad is the pain?   (Scale 1-10; mild, moderate or severe)   - MILD (1-3): doesn't interfere with normal activities    - MODERATE (4-7): interferes with normal activities (e.g., work or school) or awakens from sleep   - SEVERE (8-10): excruciating pain and patient unable to do any normal activities        Was pretty bad this morning but feels a little better after using OTC remedy  3. NASAL CONGESTION: Is the nose blocked?     Patient does have nasal congestion   4. FEVER: Do you have a fever? If Yes, ask:  What is it, how was it measured, and when did it start?      Felt sweaty last night, now now  5. OTHER SYMPTOMS: Do you have any other symptoms? (e.g., sore throat, cough, earache, difficulty breathing)     Sore throat, cough  Protocols used: Sore Throat-A-AH, Sinus Pain or Congestion-A-AH  Chief Complaint: Sore throat Symptoms: Sinus pain, sore throat, cough, congestion Pertinent Negatives: Patient denies fever Disposition: [] ED /[] Urgent Care (no appt availability in office) / [x] Appointment(In office/virtual)/ []  [facility]/ [] Home Care/ [] Refused Recommended Disposition /[] [facility]/ []  Follow-up with PCP Additional Notes: Patient has been feeling sick for the past couple days. She is having sinus pain, sore throat, cough, congestion. Patient has tried using Flonase , Tylenol , Mucinex, cough drops, without much relief. No appointment available in the office. Patient has virtual appointment scheduled later today

## 2023-07-19 NOTE — Patient Instructions (Signed)
  Angela Shepard, thank you for joining Angela Borg, [name]  While this provider is not your primary care provider (PCP), if your PCP is located in our provider database this encounter information will be shared with them immediately following your visit.   A [facility] MyChart account gives you access to today's visit and all your visits, tests, and labs performed at [facility]  click here if you don't have a [facility] MyChart account or go to [email or web address]  Consent: (Patient) Angela Shepard provided verbal consent for this virtual visit at the beginning of the encounter.  Current Medications:  Current Outpatient Medications:    cefdinir  (OMNICEF ) 300 MG capsule, Take 1 capsule (300 mg total) by mouth 2 (two) times daily., Disp: 14 capsule, Rfl: 0   chlorhexidine  (PERIDEX ) 0.12 % solution, Use as directed 15 mLs in the mouth or throat 2 (two) times daily., Disp: 120 mL, Rfl: 0   metroNIDAZOLE  (FLAGYL ) 500 MG tablet, Take 1 tablet (500 mg total) by mouth 2 (two) times daily. (Patient not taking: Reported on 06/22/2023), Disp: 14 tablet, Rfl: 0   Prenatal Vit-Fe Fumarate-FA (PRENATAL VITAMIN PO), Take 1 tablet by mouth daily., Disp: , Rfl:    Medications ordered in this encounter:  No orders of the defined types were placed in this encounter.    *If you need refills on other medications prior to your next appointment, please contact your pharmacy*  Follow-Up: Call back or seek an in-person evaluation if the symptoms worsen or if the condition fails to improve as anticipated.  [facility] [phone]  Other Instructions Stay well hydrated with clear liquids May continue with Flonase  and Tylenol . Continue with humidifier Steam from warm shower will help with nasal congestion Continue to watch for worsening symptoms. Contact OB if symptoms don't improve or worsen.   If you have been instructed to have an in-person  evaluation today at a local Urgent Care facility, please use the link below. It will take you to a list of all of our available [facility], including address, phone number and hours of operation. Please do not delay care.  [facility]  If you or a family member do not have a primary care provider, use the link below to schedule a visit and establish care. When you choose a [facility] primary care physician or advanced practice provider, you gain a long-term partner in health. Find a Primary Care Provider  Learn more about [facility]'s in-office and virtual care options: [facility] - Get Care Now

## 2023-07-20 ENCOUNTER — Encounter: Payer: Self-pay | Admitting: Family Medicine

## 2023-07-21 ENCOUNTER — Other Ambulatory Visit: Payer: Medicaid Other

## 2023-07-21 ENCOUNTER — Ambulatory Visit (INDEPENDENT_AMBULATORY_CARE_PROVIDER_SITE_OTHER): Payer: Medicaid Other | Admitting: Obstetrics & Gynecology

## 2023-07-21 VITALS — BP 106/73 | HR 90 | Wt 158.9 lb

## 2023-07-21 DIAGNOSIS — Z3480 Encounter for supervision of other normal pregnancy, unspecified trimester: Secondary | ICD-10-CM | POA: Diagnosis not present

## 2023-07-21 DIAGNOSIS — O24419 Gestational diabetes mellitus in pregnancy, unspecified control: Secondary | ICD-10-CM

## 2023-07-21 DIAGNOSIS — Z3A27 27 weeks gestation of pregnancy: Secondary | ICD-10-CM | POA: Diagnosis not present

## 2023-07-21 NOTE — Progress Notes (Signed)
   PRENATAL VISIT NOTE  Subjective:  SHEBA WHALING is a 28 y.o. G1P0000 at [redacted]w[redacted]d being seen today for ongoing prenatal care.  She is currently monitored for the following issues for this low-risk pregnancy and has Chronic migraine without aura without status migrainosus, not intractable; Irritable bowel syndrome with diarrhea; Generalized anxiety disorder; Impaired fasting glucose; Vitamin D  deficiency; and Supervision of other normal pregnancy, antepartum on their problem list.  Patient reports  sinus pressure, discussed vaping and marijuana use and desire to quit/decrease .  Contractions: Irritability. Vag. Bleeding: None.  Movement: Present. Denies leaking of fluid.   The following portions of the patient's history were reviewed and updated as appropriate: allergies, current medications, past family history, past medical history, past social history, past surgical history and problem list.   Objective:   Vitals:   07/21/23 0851  BP: 106/73  Pulse: 90  Weight: 158 lb 14.4 oz (72.1 kg)    Fetal Status: Fetal Heart Rate (bpm): 147   Movement: Present     General:  Alert, oriented and cooperative. Patient is in no acute distress.  Skin: Skin is warm and dry. No rash noted.   Cardiovascular: Normal heart rate noted  Respiratory: Normal respiratory effort, no problems with respiration noted  Abdomen: Soft, gravid, appropriate for gestational age.  Pain/Pressure: Absent     Pelvic: Cervical exam deferred        Extremities: Normal range of motion.  Edema: None  Mental Status: Normal mood and affect. Normal behavior. Normal judgment and thought content.   Assessment and Plan:  Pregnancy: G1P0000 at 100w1d 1. Supervision of other normal pregnancy, antepartum (Primary)  - Glucose Tolerance, 2 Hours w/1 Hour - CBC - HIV Antibody (routine testing w rflx) - RPR  2. [redacted] weeks gestation of pregnancy Try to stop vaping and marijuana - Glucose Tolerance, 2 Hours w/1 Hour - CBC - HIV  Antibody (routine testing w rflx) - RPR  Preterm labor symptoms and general obstetric precautions including but not limited to vaginal bleeding, contractions, leaking of fluid and fetal movement were reviewed in detail with the patient. Please refer to After Visit Summary for other counseling recommendations.   Return in about 3 weeks (around 08/11/2023).  Future Appointments  Date Time Provider Department Center  09/25/2023  9:10 AM Wallace Joesph LABOR, PA [facility] None    Lynwood Solomons, MD

## 2023-07-22 LAB — GLUCOSE TOLERANCE, 2 HOURS W/ 1HR
Glucose, 1 hour: 182 mg/dL — ABNORMAL HIGH (ref 70–179)
Glucose, 2 hour: 164 mg/dL — ABNORMAL HIGH (ref 70–152)
Glucose, Fasting: 76 mg/dL (ref 70–91)

## 2023-07-22 LAB — CBC
Hematocrit: 31.4 % — ABNORMAL LOW (ref 34.0–46.6)
Hemoglobin: 10.4 g/dL — ABNORMAL LOW (ref 11.1–15.9)
MCH: 29.8 pg (ref 26.6–33.0)
MCHC: 33.1 g/dL (ref 31.5–35.7)
MCV: 90 fL (ref 79–97)
Platelets: 212 10*3/uL (ref 150–450)
RBC: 3.49 x10E6/uL — ABNORMAL LOW (ref 3.77–5.28)
RDW: 11.9 % (ref 11.7–15.4)
WBC: 9.1 10*3/uL (ref 3.4–10.8)

## 2023-07-22 LAB — HIV ANTIBODY (ROUTINE TESTING W REFLEX): HIV Screen 4th Generation wRfx: NONREACTIVE

## 2023-07-22 LAB — RPR: RPR Ser Ql: NONREACTIVE

## 2023-07-24 ENCOUNTER — Other Ambulatory Visit: Payer: Self-pay

## 2023-07-24 ENCOUNTER — Encounter: Payer: Self-pay | Admitting: Obstetrics & Gynecology

## 2023-07-24 DIAGNOSIS — O24419 Gestational diabetes mellitus in pregnancy, unspecified control: Secondary | ICD-10-CM

## 2023-07-24 MED ORDER — ACCU-CHEK GUIDE W/DEVICE KIT: 1.0000 | PACK | Freq: Four times a day (QID) | 0 refills | Status: DC

## 2023-07-24 MED ORDER — ACCU-CHEK SOFTCLIX LANCETS MISC: 1.0000 | Freq: Four times a day (QID) | 12 refills | Status: DC

## 2023-07-24 MED ORDER — ACCU-CHEK GUIDE TEST VI STRP: 1.0000 | ORAL_STRIP | Freq: Four times a day (QID) | 12 refills | Status: DC

## 2023-07-24 NOTE — Progress Notes (Signed)
 Gestational diabetes supplies sent as well as referral to dietician.

## 2023-07-31 ENCOUNTER — Encounter: Payer: Self-pay | Admitting: Obstetrics & Gynecology

## 2023-07-31 DIAGNOSIS — Z8632 Personal history of gestational diabetes: Secondary | ICD-10-CM | POA: Insufficient documentation

## 2023-07-31 DIAGNOSIS — O24419 Gestational diabetes mellitus in pregnancy, unspecified control: Secondary | ICD-10-CM | POA: Insufficient documentation

## 2023-07-31 NOTE — Progress Notes (Signed)
Patient needs diabetes education

## 2023-08-01 ENCOUNTER — Other Ambulatory Visit: Payer: Self-pay | Admitting: *Deleted

## 2023-08-01 NOTE — Progress Notes (Signed)
GDM supplies sent and referral to diabetes and nutrition ordered previously on 07/24/23.

## 2023-08-02 ENCOUNTER — Encounter: Payer: Medicaid Other | Attending: Obstetrics and Gynecology | Admitting: Dietician

## 2023-08-02 ENCOUNTER — Encounter: Payer: Self-pay | Admitting: Dietician

## 2023-08-02 DIAGNOSIS — Z713 Dietary counseling and surveillance: Secondary | ICD-10-CM | POA: Diagnosis not present

## 2023-08-02 DIAGNOSIS — O24419 Gestational diabetes mellitus in pregnancy, unspecified control: Secondary | ICD-10-CM | POA: Diagnosis present

## 2023-08-02 DIAGNOSIS — Z3A00 Weeks of gestation of pregnancy not specified: Secondary | ICD-10-CM | POA: Insufficient documentation

## 2023-08-02 NOTE — Progress Notes (Signed)
Patient was seen on 08/02/2023 for Gestational Diabetes self-management class at the [facility]. The following learning objectives were met by the patient during this course:  States the definition of Gestational Diabetes States why dietary management is important in controlling blood glucose Describes the effects each nutrient has on blood glucose levels Demonstrates ability to create a balanced meal plan Demonstrates carbohydrate counting  States when to check blood glucose levels Demonstrates proper blood glucose monitoring techniques States the effect of stress and exercise on blood glucose levels States the importance of limiting caffeine and abstaining from alcohol and smoking  Blood glucose monitor given: Pt presents with accu chek meter Fasting:  76, 86, 84, 85 mg/dL  2 hour post prandial: 89,104,100 mg/dl  Blood glucose reading: QID  Patient instructed to monitor glucose levels: FBS: 60 - <90 1 hour: <140 2 hour: <120  *Patient received handouts: Nutrition Diabetes and Pregnancy Carbohydrate Counting List Blood glucose log Snack ideas for diabetes during pregnancy  Patient will be seen for follow-up as needed.

## 2023-08-10 ENCOUNTER — Ambulatory Visit (INDEPENDENT_AMBULATORY_CARE_PROVIDER_SITE_OTHER): Payer: Medicaid Other | Admitting: Obstetrics and Gynecology

## 2023-08-10 VITALS — BP 118/71 | HR 74 | Wt 167.4 lb

## 2023-08-10 DIAGNOSIS — O24419 Gestational diabetes mellitus in pregnancy, unspecified control: Secondary | ICD-10-CM | POA: Diagnosis not present

## 2023-08-10 DIAGNOSIS — Z3A30 30 weeks gestation of pregnancy: Secondary | ICD-10-CM | POA: Diagnosis not present

## 2023-08-10 DIAGNOSIS — Z3480 Encounter for supervision of other normal pregnancy, unspecified trimester: Secondary | ICD-10-CM | POA: Diagnosis not present

## 2023-08-10 MED ORDER — ACCU-CHEK SOFTCLIX LANCETS MISC: 1.0000 | Freq: Four times a day (QID) | 12 refills | Status: DC

## 2023-08-10 MED ORDER — ACCU-CHEK GUIDE TEST VI STRP: 1.0000 | ORAL_STRIP | Freq: Four times a day (QID) | 12 refills | Status: DC

## 2023-08-10 NOTE — Progress Notes (Addendum)
 ROB, no concerns. Pt needs more lancets and strips sent in for meter. Used to many when she was first learning and is running out

## 2023-08-10 NOTE — Progress Notes (Signed)
   PRENATAL VISIT NOTE  Subjective:  Angela Shepard is a 28 y.o. G1P0000 at [redacted]w[redacted]d being seen today for ongoing prenatal care.  She is currently monitored for the following issues for this low-risk pregnancy and has Chronic migraine without aura without status migrainosus, not intractable; Irritable bowel syndrome with diarrhea; Generalized anxiety disorder; Impaired fasting glucose; Vitamin D deficiency; Supervision of other normal pregnancy, antepartum; and Gestational diabetes on their problem list.  Patient reports no complaints.  Contractions: Irritability. Vag. Bleeding: None.  Movement: Present. Denies leaking of fluid.   The following portions of the patient's history were reviewed and updated as appropriate: allergies, current medications, past family history, past medical history, past social history, past surgical history and problem list.   Objective:   Vitals:   08/10/23 1431  BP: 118/71  Pulse: 74  Weight: 167 lb 6.4 oz (75.9 kg)    Fetal Status: Fetal Heart Rate (bpm): 145   Movement: Present     General:  Alert, oriented and cooperative. Patient is in no acute distress.  Skin: Skin is warm and dry. No rash noted.   Cardiovascular: Normal heart rate noted  Respiratory: Normal respiratory effort, no problems with respiration noted  Abdomen: Soft, gravid, appropriate for gestational age.  Pain/Pressure: Absent     Pelvic: Cervical exam deferred        Extremities: Normal range of motion.  Edema: None  Mental Status: Normal mood and affect. Normal behavior. Normal judgment and thought content.   Assessment and Plan:  Pregnancy: G1P0000 at [redacted]w[redacted]d 1. Supervision of other normal pregnancy, antepartum (Primary) BP and FHR normal Doing well, feeling regular movement    2. Gestational diabetes mellitus (GDM) in third trimester, gestational diabetes method of control unspecified Fastings and postprandials all within rane with diet Met with nutrition on 2/19 Schedule  follow up growth today  - Accu-Chek Softclix Lancets lancets; 1 each by Other route 4 (four) times daily. Use as instructed  Dispense: 200 each; Refill: 12 - glucose blood (ACCU-CHEK GUIDE TEST) test strip; 1 each by Other route 4 (four) times daily. 1 strip 4 times daily  Dispense: 200 each; Refill: 12 - [place] [facility] OB FOLLOW UP; Future  3. [redacted] weeks gestation of pregnancy Continue routine prenatal care    Preterm labor symptoms and general obstetric precautions including but not limited to vaginal bleeding, contractions, leaking of fluid and fetal movement were reviewed in detail with the patient. Please refer to After Visit Summary for other counseling recommendations.   Return in about 2 weeks (around 08/24/2023) for OB VISIT (MD or APP).   Albertine Grates, FNP

## 2023-08-17 ENCOUNTER — Ambulatory Visit

## 2023-08-17 ENCOUNTER — Telehealth: Payer: Self-pay

## 2023-08-17 DIAGNOSIS — Z0130 Encounter for examination of blood pressure without abnormal findings: Secondary | ICD-10-CM

## 2023-08-17 NOTE — Progress Notes (Signed)
 Subjective:  Angela Shepard is a 28 y.o. female here for BP check.   Hypertension ROS: Currently on no BP medications, no headaches, blurry vision, or RUQ pain. Pt does report increased swelling of feet/ankles. Discussed work schedule. 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 Encouraged pt to rest, elevate feet as much as possible. Also encouraged compression socks. Pt has a prenatal visit in a week to follow up. Baby movement present, no concerns besides increased pelvic pain.   Objective:  LMP 01/12/2023 (Exact Date)   Appearance alert, well appearing, and in no distress. General exam BP noted to be well controlled today in office.    Assessment:   Blood Pressure well controlled.   Plan:  Current treatment plan is effective, no change in therapy.

## 2023-08-17 NOTE — Telephone Encounter (Signed)
 S/w pt who stated that in the last 2 days she has been experiencing a lot of swelling and numbness/tingling in ankles and feet that has not been relieved with elevation/rest. Pt denies headaches, visual changes, but states that she doe snot have a BP cuff. Transferred to scheduler for appt.

## 2023-08-24 ENCOUNTER — Encounter: Payer: Self-pay | Admitting: Obstetrics and Gynecology

## 2023-08-24 ENCOUNTER — Ambulatory Visit (INDEPENDENT_AMBULATORY_CARE_PROVIDER_SITE_OTHER): Payer: Medicaid Other | Admitting: Obstetrics and Gynecology

## 2023-08-24 VITALS — BP 114/69 | HR 75 | Wt 176.6 lb

## 2023-08-24 DIAGNOSIS — Z3480 Encounter for supervision of other normal pregnancy, unspecified trimester: Secondary | ICD-10-CM | POA: Diagnosis not present

## 2023-08-24 DIAGNOSIS — Z3A32 32 weeks gestation of pregnancy: Secondary | ICD-10-CM | POA: Diagnosis not present

## 2023-08-24 DIAGNOSIS — O24419 Gestational diabetes mellitus in pregnancy, unspecified control: Secondary | ICD-10-CM

## 2023-08-24 NOTE — Progress Notes (Signed)
 Pt presents for ROB visit. No concerns

## 2023-08-24 NOTE — Progress Notes (Signed)
   PRENATAL VISIT NOTE  Subjective:  Angela Shepard is a 28 y.o. G1P0000 at [redacted]w[redacted]d being seen today for ongoing prenatal care.  She is currently monitored for the following issues for this low-risk pregnancy and has Chronic migraine without aura without status migrainosus, not intractable; Irritable bowel syndrome with diarrhea; Generalized anxiety disorder; Impaired fasting glucose; Vitamin D deficiency; Supervision of other normal pregnancy, antepartum; and Gestational diabetes on their problem list.  Patient reports no complaints.  Contractions: Irritability. Vag. Bleeding: None.  Movement: Present. Denies leaking of fluid.   The following portions of the patient's history were reviewed and updated as appropriate: allergies, current medications, past family history, past medical history, past social history, past surgical history and problem list.   Objective:   Vitals:   08/24/23 1355  BP: 114/69  Pulse: 75  Weight: 176 lb 9.6 oz (80.1 kg)    Fetal Status: Fetal Heart Rate (bpm): 147   Movement: Present     General:  Alert, oriented and cooperative. Patient is in no acute distress.  Skin: Skin is warm and dry. No rash noted.   Cardiovascular: Normal heart rate noted  Respiratory: Normal respiratory effort, no problems with respiration noted  Abdomen: Soft, gravid, appropriate for gestational age.  Pain/Pressure: Present     Pelvic: Cervical exam deferred        Extremities: Normal range of motion.  Edema: None  Mental Status: Normal mood and affect. Normal behavior. Normal judgment and thought content.   Assessment and Plan:  Pregnancy: G1P0000 at [redacted]w[redacted]d 1. Supervision of other normal pregnancy, antepartum (Primary) BP and FHR normal Doing well, feeling regular movement    2. Gestational diabetes mellitus (GDM) in third trimester, gestational diabetes method of control unspecified Follow up u/s 3/18 Majority of postprandials are normal, most recent "fastings" have not been  fasting,she has checked after she has eaten something. Previous fastings normal. Encouraged checking as much as she can prior to breakfast so we can assess control Discussed delivery timing likely around 39-39.6 if well controlled   3. [redacted] weeks gestation of pregnancy Anticipatory guidance regarding upcoming appts   Preterm labor symptoms and general obstetric precautions including but not limited to vaginal bleeding, contractions, leaking of fluid and fetal movement were reviewed in detail with the patient. Please refer to After Visit Summary for other counseling recommendations.   Return in about 2 weeks (around 09/07/2023).  Albertine Grates, FNP

## 2023-08-29 ENCOUNTER — Ambulatory Visit: Payer: Medicaid Other | Admitting: *Deleted

## 2023-08-29 ENCOUNTER — Encounter: Payer: Self-pay | Admitting: *Deleted

## 2023-08-29 ENCOUNTER — Ambulatory Visit: Payer: Medicaid Other | Attending: Obstetrics and Gynecology

## 2023-08-29 VITALS — BP 133/67 | HR 78

## 2023-08-29 DIAGNOSIS — Z3A32 32 weeks gestation of pregnancy: Secondary | ICD-10-CM | POA: Insufficient documentation

## 2023-08-29 DIAGNOSIS — O24419 Gestational diabetes mellitus in pregnancy, unspecified control: Secondary | ICD-10-CM | POA: Diagnosis not present

## 2023-08-29 DIAGNOSIS — Z3480 Encounter for supervision of other normal pregnancy, unspecified trimester: Secondary | ICD-10-CM

## 2023-08-29 DIAGNOSIS — Z362 Encounter for other antenatal screening follow-up: Secondary | ICD-10-CM | POA: Insufficient documentation

## 2023-08-30 ENCOUNTER — Other Ambulatory Visit: Payer: Self-pay | Admitting: *Deleted

## 2023-08-30 DIAGNOSIS — O24419 Gestational diabetes mellitus in pregnancy, unspecified control: Secondary | ICD-10-CM

## 2023-09-07 ENCOUNTER — Ambulatory Visit: Admitting: Obstetrics and Gynecology

## 2023-09-07 VITALS — BP 118/72 | HR 81 | Wt 181.0 lb

## 2023-09-07 DIAGNOSIS — Z3A34 34 weeks gestation of pregnancy: Secondary | ICD-10-CM | POA: Diagnosis not present

## 2023-09-07 DIAGNOSIS — O24410 Gestational diabetes mellitus in pregnancy, diet controlled: Secondary | ICD-10-CM | POA: Diagnosis not present

## 2023-09-07 DIAGNOSIS — Z3480 Encounter for supervision of other normal pregnancy, unspecified trimester: Secondary | ICD-10-CM

## 2023-09-07 NOTE — Progress Notes (Signed)
   PRENATAL VISIT NOTE  Subjective:  Angela Shepard is a 28 y.o. G1P0000 at [redacted]w[redacted]d being seen today for ongoing prenatal care.  She is currently monitored for the following issues for this high-risk pregnancy and has Chronic migraine without aura without status migrainosus, not intractable; Irritable bowel syndrome with diarrhea; Generalized anxiety disorder; Impaired fasting glucose; Vitamin D deficiency; Supervision of other normal pregnancy, antepartum; and Gestational diabetes on their problem list.  Patient doing well with no acute concerns today. She reports no complaints.  Contractions: Not present. Vag. Bleeding: None.  Movement: Present. Denies leaking of fluid.   The following portions of the patient's history were reviewed and updated as appropriate: allergies, current medications, past family history, past medical history, past social history, past surgical history and problem list. Problem list updated.  Objective:   Vitals:   09/07/23 1442  BP: 118/72  Pulse: 81  Weight: 181 lb (82.1 kg)    Fetal Status: Fetal Heart Rate (bpm): 140 Fundal Height: 34 cm Movement: Present     General:  Alert, oriented and cooperative. Patient is in no acute distress.  Skin: Skin is warm and dry. No rash noted.   Cardiovascular: Normal heart rate noted  Respiratory: Normal respiratory effort, no problems with respiration noted  Abdomen: Soft, gravid, appropriate for gestational age.  Pain/Pressure: Present     Pelvic: Cervical exam deferred        Extremities: Normal range of motion.     Mental Status:  Normal mood and affect. Normal behavior. Normal judgment and thought content.   Assessment and Plan:  Pregnancy: G1P0000 at [redacted]w[redacted]d  1. [redacted] weeks gestation of pregnancy (Primary)   2. Diet controlled gestational diabetes mellitus (GDM) in third trimester FBS: 77-114, most in range PPBS: 86-146, most in range, did emphasize good dietary choices Growth scan 09/28/23  3. Supervision of  other normal pregnancy, antepartum Continue routine prenatal care  Preterm labor symptoms and general obstetric precautions including but not limited to vaginal bleeding, contractions, leaking of fluid and fetal movement were reviewed in detail with the patient.  Please refer to After Visit Summary for other counseling recommendations.   Return in about 2 weeks (around 09/21/2023) for [facility], in person, 36 weeks swabs.   Mariel Aloe, MD Faculty Attending Center for [facility]

## 2023-09-17 NOTE — Progress Notes (Unsigned)
   PRENATAL VISIT NOTE  Subjective:  Angela Shepard is a 28 y.o. G1P0000 at 108w6d being seen today for ongoing prenatal care.  She is currently monitored for the following issues for this high-risk pregnancy and has Chronic migraine without aura without status migrainosus, not intractable; Irritable bowel syndrome with diarrhea; Generalized anxiety disorder; Impaired fasting glucose; Vitamin D deficiency; Supervision of other normal pregnancy, antepartum; and Gestational diabetes on their problem list.  Patient reports no concerns.  Contractions: Irritability. Vag. Bleeding: None.  Movement: Increased. Denies leaking of fluid.   The following portions of the patient's history were reviewed and updated as appropriate: allergies, current medications, past family history, past medical history, past social history, past surgical history and problem list.   Objective:   Vitals:   09/20/23 0857  BP: 118/77  Pulse: 72  Weight: 183 lb 3.2 oz (83.1 kg)    Fetal Status: Fetal Heart Rate (bpm): 152 Fundal Height: 35 cm Movement: Increased     General:  Alert, oriented and cooperative. Patient is in no acute distress.  Skin: Skin is warm and dry. No rash noted.   Cardiovascular: Normal heart rate noted  Respiratory: Normal respiratory effort, no problems with respiration noted  Abdomen: Soft, gravid, appropriate for gestational age.  Pain/Pressure: Present     Pelvic: Cervical exam deferred        Extremities: Normal range of motion.  Edema: Mild pitting, slight indentation  Mental Status: Normal mood and affect. Normal behavior. Normal judgment and thought content.   Assessment and Plan:  Pregnancy: G1P0000 at [redacted]w[redacted]d  1. Supervision of other normal pregnancy, antepartum (Primary) Patient is doing well, feeling regular fetal movement BP, FHR, FH appropriate  2. [redacted] weeks gestation of pregnancy Anticipatory guidance about next visits/weeks of pregnancy given - Cervicovaginal ancillary  only - Strep Gp B Culture+Rflx  3. Diet controlled gestational diabetes mellitus (GDM) in third trimester Well-controlled; viewed readings on patient's monitoring device FBS: 78-90 with >80% WNL PPBS:90-147 with >80% WNL 08/29/23 EFW 51%, normal AFI; upcoming scan 09/28/23  Preterm labor symptoms and general obstetric precautions including but not limited to vaginal bleeding, contractions, leaking of fluid and fetal movement were reviewed in detail with the patient.  Please refer to After Visit Summary for other counseling recommendations.   Return in about 1 week (around 09/27/2023) for [facility].  Future Appointments  Date Time Provider Department Center  09/28/2023 10:55 AM Shea Evans, Gillian Scarce, MD [facility] None  09/28/2023  1:15 PM [facility] NURSE [facility]  09/28/2023  1:30 PM [facility] US7 [facility]    Ralene Muskrat, PA-C

## 2023-09-20 ENCOUNTER — Other Ambulatory Visit (HOSPITAL_COMMUNITY)
Admission: RE | Admit: 2023-09-20 | Discharge: 2023-09-20 | Disposition: A | Discharge status: Home / Self Care | Source: Ambulatory Visit | Attending: Physician Assistant | Admitting: Physician Assistant

## 2023-09-20 ENCOUNTER — Encounter: Payer: Self-pay | Admitting: Physician Assistant

## 2023-09-20 ENCOUNTER — Ambulatory Visit: Admitting: Physician Assistant

## 2023-09-20 VITALS — BP 118/77 | HR 72 | Wt 183.2 lb

## 2023-09-20 DIAGNOSIS — Z3A35 35 weeks gestation of pregnancy: Secondary | ICD-10-CM

## 2023-09-20 DIAGNOSIS — Z3480 Encounter for supervision of other normal pregnancy, unspecified trimester: Secondary | ICD-10-CM | POA: Insufficient documentation

## 2023-09-20 DIAGNOSIS — O24410 Gestational diabetes mellitus in pregnancy, diet controlled: Secondary | ICD-10-CM | POA: Diagnosis not present

## 2023-09-20 LAB — CERVICOVAGINAL ANCILLARY ONLY
Chlamydia: NEGATIVE
Comment: NEGATIVE
Comment: NEGATIVE
Comment: NORMAL
Neisseria Gonorrhea: NEGATIVE
Trichomonas: NEGATIVE

## 2023-09-20 NOTE — Progress Notes (Signed)
 Has noticed edema to feet recently. Increased discharge

## 2023-09-21 ENCOUNTER — Encounter: Admitting: Obstetrics and Gynecology

## 2023-09-24 LAB — STREP GP B CULTURE+RFLX: Strep Gp B Culture+Rflx: NEGATIVE

## 2023-09-25 ENCOUNTER — Encounter: Payer: 59 | Admitting: Nurse Practitioner

## 2023-09-25 ENCOUNTER — Encounter: Payer: 59 | Admitting: Family Medicine

## 2023-09-28 ENCOUNTER — Ambulatory Visit: Admitting: Maternal & Fetal Medicine

## 2023-09-28 ENCOUNTER — Ambulatory Visit

## 2023-09-28 ENCOUNTER — Ambulatory Visit: Admitting: Obstetrics and Gynecology

## 2023-09-28 ENCOUNTER — Ambulatory Visit: Attending: Maternal & Fetal Medicine

## 2023-09-28 ENCOUNTER — Encounter: Admitting: Obstetrics and Gynecology

## 2023-09-28 ENCOUNTER — Encounter: Payer: Self-pay | Admitting: Maternal & Fetal Medicine

## 2023-09-28 VITALS — BP 122/72 | HR 83

## 2023-09-28 VITALS — BP 116/80 | HR 91 | Wt 183.0 lb

## 2023-09-28 DIAGNOSIS — O24419 Gestational diabetes mellitus in pregnancy, unspecified control: Secondary | ICD-10-CM

## 2023-09-28 DIAGNOSIS — Z3A36 36 weeks gestation of pregnancy: Secondary | ICD-10-CM | POA: Insufficient documentation

## 2023-09-28 DIAGNOSIS — Z3480 Encounter for supervision of other normal pregnancy, unspecified trimester: Secondary | ICD-10-CM | POA: Diagnosis not present

## 2023-09-28 DIAGNOSIS — O24410 Gestational diabetes mellitus in pregnancy, diet controlled: Secondary | ICD-10-CM | POA: Diagnosis present

## 2023-09-28 DIAGNOSIS — Z3A37 37 weeks gestation of pregnancy: Secondary | ICD-10-CM

## 2023-09-28 DIAGNOSIS — F419 Anxiety disorder, unspecified: Secondary | ICD-10-CM | POA: Diagnosis not present

## 2023-09-28 DIAGNOSIS — Z362 Encounter for other antenatal screening follow-up: Secondary | ICD-10-CM | POA: Diagnosis not present

## 2023-09-28 DIAGNOSIS — O99340 Other mental disorders complicating pregnancy, unspecified trimester: Secondary | ICD-10-CM | POA: Diagnosis not present

## 2023-09-28 NOTE — Progress Notes (Signed)
   PRENATAL VISIT NOTE  Subjective:  Angela Shepard is a 28 y.o. G1P0000 at [redacted]w[redacted]d being seen today for ongoing prenatal care.  She is currently monitored for the following issues for this high-risk pregnancy and has Chronic migraine without aura without status migrainosus, not intractable; Irritable bowel syndrome with diarrhea; Generalized anxiety disorder; Impaired fasting glucose; Vitamin D deficiency; Supervision of other normal pregnancy, antepartum; and Gestational diabetes on their problem list.  Patient reports no complaints.  Contractions: Not present. Vag. Bleeding: None.  Movement: Present. Denies leaking of fluid.   Last growth 3/18 with EFW 51%  The following portions of the patient's history were reviewed and updated as appropriate: allergies, current medications, past family history, past medical history, past social history, past surgical history and problem list.   Objective:   Vitals:   09/28/23 1136  BP: 116/80  Pulse: 91  Weight: 183 lb (83 kg)   Body mass index is 31.41 kg/m. Total weight gain: 33 lb (15 kg)   Fetal Status: Fetal Heart Rate (bpm): 145 Fundal Height: 36 cm Movement: Present     General:  Alert, oriented and cooperative. Patient is in no acute distress.  Skin: Skin is warm and dry. No rash noted.   Cardiovascular: Normal heart rate noted  Respiratory: Normal respiratory effort, no problems with respiration noted  Abdomen: Soft, gravid, appropriate for gestational age.  Pain/Pressure: Present     Pelvic: Cervical exam deferred        Extremities: Normal range of motion.  Edema: Trace  Mental Status: Normal mood and affect. Normal behavior. Normal judgment and thought content.   Assessment and Plan:  Pregnancy: G1P0000 at [redacted]w[redacted]d 1. Supervision of other normal pregnancy, antepartum (Primary) Doing well. GBS neg MFM growth ultrasound this afternoon, will determine timing of delivery pending these results, but likely 39-40 wks  2. Diet  controlled gestational diabetes mellitus (GDM) in third trimester Excellent control with nearly all blood sugars within range Continue diet control  3. Anxiety during pregnancy Counseled on risks of postpartum depression. No meds currently. Discussed restarting zoloft postpartum. Safe during breastfeeding.   Term labor symptoms and general obstetric precautions including but not limited to vaginal bleeding, contractions, leaking of fluid and fetal movement were reviewed in detail with the patient. Please refer to After Visit Summary for other counseling recommendations.   Return in about 1 week (around 10/05/2023).  Future Appointments  Date Time Provider Department Center  09/28/2023  1:00 PM [facility] PROVIDER 1 [facility]  09/28/2023  1:30 PM [facility] US1 [facility]    Marci Setter, MD

## 2023-09-29 NOTE — Progress Notes (Signed)
No consult needed.

## 2023-10-05 ENCOUNTER — Encounter: Payer: Self-pay | Admitting: Obstetrics and Gynecology

## 2023-10-05 ENCOUNTER — Ambulatory Visit (INDEPENDENT_AMBULATORY_CARE_PROVIDER_SITE_OTHER): Admitting: Obstetrics and Gynecology

## 2023-10-05 VITALS — BP 138/85 | HR 79 | Wt 188.4 lb

## 2023-10-05 DIAGNOSIS — Z3480 Encounter for supervision of other normal pregnancy, unspecified trimester: Secondary | ICD-10-CM

## 2023-10-05 DIAGNOSIS — Z3A38 38 weeks gestation of pregnancy: Secondary | ICD-10-CM | POA: Diagnosis not present

## 2023-10-05 DIAGNOSIS — O24410 Gestational diabetes mellitus in pregnancy, diet controlled: Secondary | ICD-10-CM

## 2023-10-05 NOTE — Progress Notes (Signed)
 Pt states pain and pressure (scale of 8) lower abdomen.    White thin discharge increased in last few days.   Would like her cervix checked. Pt phoned this clinic this morning due to pain and pressure.   Wants to confirm induction date.

## 2023-10-05 NOTE — Progress Notes (Signed)
   PRENATAL VISIT NOTE  Subjective:  Angela Shepard is a 28 y.o. G1P1001 at [redacted]w[redacted]d being seen today for ongoing prenatal care.  She is currently monitored for the following issues for this high-risk pregnancy and has Chronic migraine without aura without status migrainosus, not intractable; Irritable bowel syndrome with diarrhea; Generalized anxiety disorder; Impaired fasting glucose; Vitamin D  deficiency; Supervision of other normal pregnancy, antepartum; Gestational diabetes; Normal labor; and Anemia of pregnancy on their problem list.  Patient reports  pelvic pressure .  Contractions: Irregular. Vag. Bleeding: None.  Movement: Increased. Denies leaking of fluid.   The following portions of the patient's history were reviewed and updated as appropriate: allergies, current medications, past family history, past medical history, past social history, past surgical history and problem list.   Objective:   Vitals:   10/05/23 1438  BP: 138/85  Pulse: 79  Weight: 188 lb 6.4 oz (85.5 kg)    Fetal Status: Fetal Heart Rate (bpm): 159   Movement: Increased  Presentation: Vertex  General:  Alert, oriented and cooperative. Patient is in no acute distress.  Skin: Skin is warm and dry. No rash noted.   Cardiovascular: Normal heart rate noted  Respiratory: Normal respiratory effort, no problems with respiration noted  Abdomen: Soft, gravid, appropriate for gestational age.  Pain/Pressure: Present     Pelvic: Cervical exam performed in the presence of a chaperone Dilation: 1 Effacement (%): 60 Station: -2  Extremities: Normal range of motion.  Edema: Mild pitting, slight indentation  Mental Status: Normal mood and affect. Normal behavior. Normal judgment and thought content.   Assessment and Plan:  Pregnancy: G1P1001 at [redacted]w[redacted]d 1. Supervision of other normal pregnancy, antepartum (Primary) BP and FHR normal Doing well, feeling regular movement    2. Diet controlled gestational diabetes mellitus  (GDM) in third trimester 4/14 u/s efw 21%, afi normal, cephalic, IOL at 39 scheduled for 5/1 Fasting and pp controlled with diet   3. [redacted] weeks gestation of pregnancy Labor precautions  Discussed IOL, to wait for call from hospital before going   Preterm labor symptoms and general obstetric precautions including but not limited to vaginal bleeding, contractions, leaking of fluid and fetal movement were reviewed in detail with the patient. Please refer to After Visit Summary for other counseling recommendations.    Future Appointments  Date Time Provider Department Center  10/12/2023  6:30 AM [facility] SCHED ROOM [facility] None    Susi Eric, FNP

## 2023-10-06 ENCOUNTER — Encounter (HOSPITAL_COMMUNITY): Payer: Self-pay | Admitting: *Deleted

## 2023-10-06 ENCOUNTER — Telehealth (HOSPITAL_COMMUNITY): Payer: Self-pay | Admitting: *Deleted

## 2023-10-06 NOTE — Telephone Encounter (Signed)
 Preadmission screen

## 2023-10-08 ENCOUNTER — Inpatient Hospital Stay (HOSPITAL_COMMUNITY)
Admission: AD | Admit: 2023-10-08 | Discharge: 2023-10-10 | DRG: 806 | Disposition: A | Discharge status: Home / Self Care | Attending: Obstetrics and Gynecology | Admitting: Obstetrics and Gynecology

## 2023-10-08 ENCOUNTER — Encounter (HOSPITAL_COMMUNITY): Payer: Self-pay | Admitting: Obstetrics and Gynecology

## 2023-10-08 ENCOUNTER — Inpatient Hospital Stay (HOSPITAL_COMMUNITY): Admitting: Anesthesiology

## 2023-10-08 ENCOUNTER — Encounter: Payer: Self-pay | Admitting: Obstetrics and Gynecology

## 2023-10-08 ENCOUNTER — Other Ambulatory Visit: Payer: Self-pay

## 2023-10-08 DIAGNOSIS — O99019 Anemia complicating pregnancy, unspecified trimester: Secondary | ICD-10-CM | POA: Insufficient documentation

## 2023-10-08 DIAGNOSIS — O26893 Other specified pregnancy related conditions, third trimester: Secondary | ICD-10-CM | POA: Diagnosis present

## 2023-10-08 DIAGNOSIS — O9902 Anemia complicating childbirth: Secondary | ICD-10-CM | POA: Diagnosis present

## 2023-10-08 DIAGNOSIS — O139 Gestational [pregnancy-induced] hypertension without significant proteinuria, unspecified trimester: Secondary | ICD-10-CM | POA: Diagnosis present

## 2023-10-08 DIAGNOSIS — Z3A38 38 weeks gestation of pregnancy: Secondary | ICD-10-CM | POA: Diagnosis not present

## 2023-10-08 DIAGNOSIS — O24419 Gestational diabetes mellitus in pregnancy, unspecified control: Secondary | ICD-10-CM | POA: Diagnosis present

## 2023-10-08 DIAGNOSIS — Z3480 Encounter for supervision of other normal pregnancy, unspecified trimester: Principal | ICD-10-CM

## 2023-10-08 DIAGNOSIS — Z880 Allergy status to penicillin: Secondary | ICD-10-CM

## 2023-10-08 DIAGNOSIS — Z8759 Personal history of other complications of pregnancy, childbirth and the puerperium: Secondary | ICD-10-CM | POA: Diagnosis present

## 2023-10-08 DIAGNOSIS — O4292 Full-term premature rupture of membranes, unspecified as to length of time between rupture and onset of labor: Secondary | ICD-10-CM | POA: Diagnosis present

## 2023-10-08 DIAGNOSIS — F1290 Cannabis use, unspecified, uncomplicated: Secondary | ICD-10-CM | POA: Diagnosis present

## 2023-10-08 DIAGNOSIS — E785 Hyperlipidemia, unspecified: Secondary | ICD-10-CM | POA: Diagnosis present

## 2023-10-08 DIAGNOSIS — O99324 Drug use complicating childbirth: Secondary | ICD-10-CM | POA: Diagnosis present

## 2023-10-08 DIAGNOSIS — O24420 Gestational diabetes mellitus in childbirth, diet controlled: Secondary | ICD-10-CM | POA: Diagnosis present

## 2023-10-08 DIAGNOSIS — O99214 Obesity complicating childbirth: Secondary | ICD-10-CM | POA: Diagnosis present

## 2023-10-08 DIAGNOSIS — O134 Gestational [pregnancy-induced] hypertension without significant proteinuria, complicating childbirth: Secondary | ICD-10-CM | POA: Diagnosis present

## 2023-10-08 DIAGNOSIS — Z8249 Family history of ischemic heart disease and other diseases of the circulatory system: Secondary | ICD-10-CM

## 2023-10-08 DIAGNOSIS — O4202 Full-term premature rupture of membranes, onset of labor within 24 hours of rupture: Secondary | ICD-10-CM | POA: Diagnosis not present

## 2023-10-08 DIAGNOSIS — Z8632 Personal history of gestational diabetes: Secondary | ICD-10-CM | POA: Diagnosis present

## 2023-10-08 LAB — COMPREHENSIVE METABOLIC PANEL WITH GFR
ALT: 16 U/L (ref 0–44)
AST: 25 U/L (ref 15–41)
Albumin: 2.7 g/dL — ABNORMAL LOW (ref 3.5–5.0)
Alkaline Phosphatase: 98 U/L (ref 38–126)
Anion gap: 11 (ref 5–15)
BUN: 8 mg/dL (ref 6–20)
CO2: 20 mmol/L — ABNORMAL LOW (ref 22–32)
Calcium: 9.3 mg/dL (ref 8.9–10.3)
Chloride: 105 mmol/L (ref 98–111)
Creatinine, Ser: 0.64 mg/dL (ref 0.44–1.00)
GFR, Estimated: >60 mL/min (ref >60)
Glucose, Bld: 89 mg/dL (ref 70–99)
Potassium: 3.7 mmol/L (ref 3.5–5.1)
Sodium: 136 mmol/L (ref 135–145)
Total Bilirubin: 0.5 mg/dL (ref 0.0–1.2)
Total Protein: 6.1 g/dL — ABNORMAL LOW (ref 6.5–8.1)

## 2023-10-08 LAB — TYPE AND SCREEN
ABO/RH(D): A POS
Antibody Screen: NEGATIVE

## 2023-10-08 LAB — RPR: RPR Ser Ql: NONREACTIVE

## 2023-10-08 LAB — CBC
HCT: 31.5 % — ABNORMAL LOW (ref 36.0–46.0)
Hemoglobin: 10 g/dL — ABNORMAL LOW (ref 12.0–15.0)
MCH: 29 pg (ref 26.0–34.0)
MCHC: 31.7 g/dL (ref 30.0–36.0)
MCV: 91.3 fL (ref 80.0–100.0)
Platelets: 237 10*3/uL (ref 150–400)
RBC: 3.45 MIL/uL — ABNORMAL LOW (ref 3.87–5.11)
RDW: 13.9 % (ref 11.5–15.5)
WBC: 12.2 10*3/uL — ABNORMAL HIGH (ref 4.0–10.5)
nRBC: 0 % (ref 0.0–0.2)

## 2023-10-08 LAB — PROTEIN / CREATININE RATIO, URINE
Creatinine, Urine: 135 mg/dL
Protein Creatinine Ratio: 0.43 mg/mg{creat} — ABNORMAL HIGH (ref 0.00–0.15)
Total Protein, Urine: 58 mg/dL

## 2023-10-08 LAB — GLUCOSE, CAPILLARY
Glucose-Capillary: 85 mg/dL (ref 70–99)
Glucose-Capillary: 72 mg/dL (ref 70–99)

## 2023-10-08 MED ORDER — SIMETHICONE 80 MG PO CHEW: 80.0000 mg | CHEWABLE_TABLET | ORAL | Status: DC | PRN

## 2023-10-08 MED ORDER — DIPHENHYDRAMINE HCL 50 MG/ML IJ SOLN: 12.5000 mg | INTRAMUSCULAR | Status: DC | PRN

## 2023-10-08 MED ORDER — LACTATED RINGERS IV SOLN: 500.0000 mL | INTRAVENOUS | Status: DC | PRN

## 2023-10-08 MED ORDER — ONDANSETRON HCL 4 MG PO TABS: 4.0000 mg | ORAL_TABLET | ORAL | Status: DC | PRN

## 2023-10-08 MED ORDER — FUROSEMIDE 20 MG PO TABS
20.0000 mg | ORAL_TABLET | Freq: Every day | ORAL | Status: DC
Start: 2023-10-09 — End: 2023-10-14
  Administered 2023-10-09 – 2023-10-10 (×2): 20 mg via ORAL
  Filled 2023-10-08 (×2): qty 1

## 2023-10-08 MED ORDER — LACTATED RINGERS IV SOLN: 500.0000 mL | Freq: Once | INTRAVENOUS | Status: DC

## 2023-10-08 MED ORDER — OXYCODONE HCL 5 MG PO TABS: 5.0000 mg | ORAL_TABLET | ORAL | Status: DC | PRN

## 2023-10-08 MED ORDER — ZOLPIDEM TARTRATE 5 MG PO TABS: 5.0000 mg | ORAL_TABLET | Freq: Every evening | ORAL | Status: DC | PRN

## 2023-10-08 MED ORDER — POTASSIUM CHLORIDE CRYS ER 20 MEQ PO TBCR
20.0000 meq | EXTENDED_RELEASE_TABLET | Freq: Every day | ORAL | Status: DC
  Administered 2023-10-09 – 2023-10-10 (×2): 20 meq via ORAL
  Filled 2023-10-08 (×2): qty 1

## 2023-10-08 MED ORDER — PHENYLEPHRINE 80 MCG/ML (10ML) SYRINGE FOR IV PUSH (FOR BLOOD PRESSURE SUPPORT): 80.0000 ug | PREFILLED_SYRINGE | INTRAVENOUS | Status: DC | PRN

## 2023-10-08 MED ORDER — TETANUS-DIPHTH-ACELL PERTUSSIS 5-2.5-18.5 LF-MCG/0.5 IM SUSY: 0.5000 mL | PREFILLED_SYRINGE | Freq: Once | INTRAMUSCULAR | Status: DC

## 2023-10-08 MED ORDER — FENTANYL-BUPIVACAINE-NACL 0.5-0.125-0.9 MG/250ML-% EP SOLN
12.0000 mL/h | EPIDURAL | Status: DC | PRN
  Administered 2023-10-08: 12 mL/h via EPIDURAL
  Filled 2023-10-08: qty 250

## 2023-10-08 MED ORDER — EPHEDRINE 5 MG/ML INJ: 10.0000 mg | INTRAVENOUS | Status: DC | PRN

## 2023-10-08 MED ORDER — ACETAMINOPHEN 325 MG PO TABS
650.0000 mg | ORAL_TABLET | ORAL | Status: DC | PRN
  Administered 2023-10-08 – 2023-10-09 (×3): 650 mg via ORAL
  Filled 2023-10-08 (×3): qty 2

## 2023-10-08 MED ORDER — OXYTOCIN BOLUS FROM INFUSION
333.0000 mL | Freq: Once | INTRAVENOUS | Status: AC
  Administered 2023-10-08: 333 mL via INTRAVENOUS

## 2023-10-08 MED ORDER — DIBUCAINE (PERIANAL) 1 % EX OINT: 1.0000 | TOPICAL_OINTMENT | CUTANEOUS | Status: DC | PRN

## 2023-10-08 MED ORDER — OXYCODONE-ACETAMINOPHEN 5-325 MG PO TABS: 1.0000 | ORAL_TABLET | ORAL | Status: DC | PRN

## 2023-10-08 MED ORDER — TERBUTALINE SULFATE 1 MG/ML IJ SOLN: 0.2500 mg | Freq: Once | INTRAMUSCULAR | Status: DC | PRN

## 2023-10-08 MED ORDER — LIDOCAINE HCL (PF) 1 % IJ SOLN
INTRAMUSCULAR | Status: DC | PRN
  Administered 2023-10-08 (×2): 5 mL via EPIDURAL

## 2023-10-08 MED ORDER — IBUPROFEN 600 MG PO TABS
600.0000 mg | ORAL_TABLET | Freq: Four times a day (QID) | ORAL | Status: DC
  Administered 2023-10-08 – 2023-10-10 (×8): 600 mg via ORAL
  Filled 2023-10-08 (×8): qty 1

## 2023-10-08 MED ORDER — OXYTOCIN-SODIUM CHLORIDE 30-0.9 UT/500ML-% IV SOLN
1.0000 m[IU]/min | INTRAVENOUS | Status: DC
  Administered 2023-10-08: 2 m[IU]/min via INTRAVENOUS
  Filled 2023-10-08: qty 500

## 2023-10-08 MED ORDER — SENNOSIDES-DOCUSATE SODIUM 8.6-50 MG PO TABS
2.0000 | ORAL_TABLET | ORAL | Status: DC
  Administered 2023-10-10: 2 via ORAL
  Filled 2023-10-08 (×2): qty 2

## 2023-10-08 MED ORDER — OXYTOCIN-SODIUM CHLORIDE 30-0.9 UT/500ML-% IV SOLN
2.5000 [IU]/h | INTRAVENOUS | Status: DC
Start: 2023-10-08 — End: 2023-10-08

## 2023-10-08 MED ORDER — LIDOCAINE HCL (PF) 1 % IJ SOLN: 30.0000 mL | INTRAMUSCULAR | Status: DC | PRN

## 2023-10-08 MED ORDER — OXYCODONE-ACETAMINOPHEN 5-325 MG PO TABS: 2.0000 | ORAL_TABLET | ORAL | Status: DC | PRN

## 2023-10-08 MED ORDER — PRENATAL MULTIVITAMIN CH
1.0000 | ORAL_TABLET | Freq: Every day | ORAL | Status: DC
  Administered 2023-10-09 – 2023-10-10 (×2): 1 via ORAL
  Filled 2023-10-08 (×2): qty 1

## 2023-10-08 MED ORDER — SOD CITRATE-CITRIC ACID 500-334 MG/5ML PO SOLN: 30.0000 mL | ORAL | Status: DC | PRN

## 2023-10-08 MED ORDER — BENZOCAINE-MENTHOL 20-0.5 % EX AERO: 1.0000 | INHALATION_SPRAY | CUTANEOUS | Status: DC | PRN

## 2023-10-08 MED ORDER — DIPHENHYDRAMINE HCL 25 MG PO CAPS: 25.0000 mg | ORAL_CAPSULE | Freq: Four times a day (QID) | ORAL | Status: DC | PRN

## 2023-10-08 MED ORDER — NIFEDIPINE ER OSMOTIC RELEASE 30 MG PO TB24
30.0000 mg | ORAL_TABLET | Freq: Every day | ORAL | Status: DC
  Administered 2023-10-08 – 2023-10-10 (×3): 30 mg via ORAL
  Filled 2023-10-08 (×3): qty 1

## 2023-10-08 MED ORDER — FENTANYL CITRATE (PF) 100 MCG/2ML IJ SOLN: 50.0000 ug | INTRAMUSCULAR | Status: DC | PRN

## 2023-10-08 MED ORDER — LACTATED RINGERS IV SOLN
INTRAVENOUS | Status: DC
Start: 2023-10-08 — End: 2023-10-08

## 2023-10-08 MED ORDER — ONDANSETRON HCL 4 MG/2ML IJ SOLN: 4.0000 mg | INTRAMUSCULAR | Status: DC | PRN

## 2023-10-08 MED ORDER — COCONUT OIL OIL: 1.0000 | TOPICAL_OIL | Status: DC | PRN

## 2023-10-08 MED ORDER — ACETAMINOPHEN 325 MG PO TABS: 650.0000 mg | ORAL_TABLET | ORAL | Status: DC | PRN

## 2023-10-08 MED ORDER — MEASLES, MUMPS & RUBELLA VAC IJ SOLR: 0.5000 mL | Freq: Once | INTRAMUSCULAR | Status: DC

## 2023-10-08 MED ORDER — ONDANSETRON HCL 4 MG/2ML IJ SOLN: 4.0000 mg | Freq: Four times a day (QID) | INTRAMUSCULAR | Status: DC | PRN

## 2023-10-08 MED ORDER — WITCH HAZEL-GLYCERIN EX PADS: 1.0000 | MEDICATED_PAD | CUTANEOUS | Status: DC | PRN

## 2023-10-08 NOTE — H&P (Signed)
 OBSTETRIC ADMISSION HISTORY AND PHYSICAL  Angela Shepard is a 28 y.o. female G1P0000 with IUP at [redacted]w[redacted]d (dated by L/11, Estimated Date of Delivery: 10/19/23) presenting for SROM at 0100.   She reports +FMs, no VB, no blurry vision, headaches or peripheral edema, and RUQ pain.    She plans on breast and formula feeding. She is undecided about plans for birth control.  She received her prenatal care at  [facility]    Prenatal History/Complications:  - A1GDM - Migraine - GAD - New elevated BP on arrival to MAU  Past Medical History: Past Medical History:  Diagnosis Date   Abdominal cramps 10/25/2022   ADHD    Anxiety    Body mass index 26.0-26.9, adult 05/16/2021   Depression    Gestational diabetes    Hyperlipidemia LDL goal <100 09/22/2022   Insomnia due to anxiety and fear 05/16/2021   Other fatigue 09/22/2022    Past Surgical History: Past Surgical History:  Procedure Laterality Date   CYSTOSCOPY WITH RETROGRADE PYELOGRAM, URETEROSCOPY AND STENT PLACEMENT Left 12/10/2018   Procedure: CYSTOSCOPY WITH RETROGRADE PYELOGRAM, URETEROSCOPY AND STENT PLACEMENT basket extraction;  Surgeon: Florencio Hunting, MD;  Location: [facility];  Service: Urology;  Laterality: Left;    Obstetrical History: OB History     Gravida  1   Para  0   Term  0   Preterm  0   AB  0   Living  0      SAB  0   IAB  0   Ectopic  0   Multiple  0   Live Births  0           Social History Social History   Socioeconomic History   Marital status: Single    Spouse name: Not on file   Number of children: Not on file   Years of education: Not on file   Highest education level: Not on file  Occupational History   Not on file  Tobacco Use   Smoking status: Never   Smokeless tobacco: Never  Vaping Use   Vaping status: Former   Substances: Flavoring  Substance and Sexual Activity   Alcohol use: Never   Drug use: Yes    Types: Marijuana    Comment: last used February   Sexual  activity: Yes    Partners: Male  Other Topics Concern   Not on file  Social History Narrative   6 serving of caffeine daily    Social Drivers of Health   Financial Resource Strain: Not on file  Food Insecurity: No Food Insecurity (08/02/2023)   Hunger Vital Sign    Worried About Running Out of Food in the Last Year: Never true    Ran Out of Food in the Last Year: Never true  Transportation Needs: Not on file  Physical Activity: Not on file  Stress: Not on file  Social Connections: Not on file    Family History: Family History  Problem Relation Age of Onset   ADD / ADHD Mother    ADD / ADHD Father    Arthritis Father    Club foot Father    Asthma Brother    Autism Brother    Hypertension Paternal Grandmother    Hypertension Paternal Grandfather    Heart disease Neg Hx     Allergies: Allergies  Allergen Reactions   Amoxicillin    Penicillins Rash    Did it involve swelling of the face/tongue/throat, SOB, or low BP? Unknown  Did it involve sudden or severe rash/hives, skin peeling, or any reaction on the inside of your mouth or nose? Unknown Did you need to seek medical attention at a hospital or doctor's office? Unknown When did it last happen?    Childhood   If all above answers are "NO", may proceed with cephalosporin use.    Medications Prior to Admission  Medication Sig Dispense Refill Last Dose/Taking   Accu-Chek Softclix Lancets lancets 1 each by Other route 4 (four) times daily. Use as instructed 200 each 12 10/08/2023 Morning   Blood Glucose Monitoring Suppl (ACCU-CHEK GUIDE) w/Device KIT 1 Device by Does not apply route 4 (four) times daily. 1 kit 0 10/08/2023 Morning   glucose blood (ACCU-CHEK GUIDE TEST) test strip 1 each by Other route 4 (four) times daily. 1 strip 4 times daily 200 each 12 10/08/2023 Morning   Prenatal Vit-Fe Fumarate-FA (PRENATAL VITAMIN PO) Take 1 tablet by mouth daily.   10/07/2023 Bedtime     Review of Systems  All systems reviewed and  negative except as stated in HPI.  Blood pressure (!) 142/87, pulse 86, temperature 98.4 F (36.9 C), temperature source Oral, resp. rate 16, height 5\' 3"  (1.6 m), weight 82.6 kg, last menstrual period 01/12/2023, SpO2 98%. General appearance: alert and cooperative Lungs: breathing comfortably on room air Heart: regular rate Abdomen: soft, non-tender; gravid Extremities: no edema of bilateral lower extremities Presentation: cephalic Fetal monitoring: 135/mod/+a/-d Uterine activity: every 4-6 min Dilation: 3 Effacement (%): 70 Station: -3 Exam by:: Hulda Mage, RN   Prenatal labs: ABO, Rh: A/Positive/-- (10/22 1140) Antibody: Negative (10/22 1140) Rubella: 6.14 (10/22 1140) RPR: Non Reactive (02/07 0842)  HBsAg: Negative (10/22 1140)  HIV: Non Reactive (02/07 0842)  GBS: Negative/-- (04/09 1323)  2 hr Glucola abnl Genetic screening  LR Anatomy US  normal Last US : At [redacted]w[redacted]d - cephalic presentation, EFW 2664g (21 %tile), AC 17%tile  Prenatal Transfer Tool  Maternal Diabetes: Yes:  Diabetes Type:  Diet controlled Genetic Screening: Normal Maternal Ultrasounds/Referrals: Normal Fetal Ultrasounds or other Referrals:  None Maternal Substance Abuse:  Yes:  Type: Marijuana Significant Maternal Medications:  None Significant Maternal Lab Results:  Group B Strep negative Number of Prenatal Visits:greater than 3 verified prenatal visits Other Comments:  None  No results found for this or any previous visit (from the past 24 hours).  Patient Active Problem List   Diagnosis Date Noted   Gestational diabetes 07/31/2023   Supervision of other normal pregnancy, antepartum 04/04/2023   Impaired fasting glucose 09/22/2022   Vitamin D  deficiency 09/22/2022   Irritable bowel syndrome with diarrhea 05/16/2021   Generalized anxiety disorder 05/16/2021   Chronic migraine without aura without status migrainosus, not intractable 11/08/2018    Assessment/Plan:  Angela Shepard is a 28  y.o. G1P0000 at [redacted]w[redacted]d here for SROM 4/27 at 0100  #Labor: Will start Pitocin 2x2 after epidural, anticipate SVD #Pain: Epidural #FWB: Cat I  #ID:  GBS neg #MOF: Breast #MOC: Undecided #Circ:  Yes  #New elevated BP: No hx elevated BP in preg  PEC labs ordered  #A1GDM: CBG q4h  AGA fetus  #Anemia of pregnancy: HgB 10.0 on presentation  consider TXA at delivery depending on labor course  Melanie Spires, MD OB Fellow, Faculty Practice [facility], Center for [facility] 10/08/2023 2:55 AM

## 2023-10-08 NOTE — Anesthesia Preprocedure Evaluation (Signed)
 Anesthesia Evaluation  Patient identified by MRN, date of birth, ID band Patient awake    Reviewed: Allergy & Precautions, Patient's Chart, lab work & pertinent test results  Airway Mallampati: II       Dental no notable dental hx.    Pulmonary neg pulmonary ROS   Pulmonary exam normal        Cardiovascular Normal cardiovascular exam Rhythm:Regular     Neuro/Psych  Headaches PSYCHIATRIC DISORDERS Anxiety Depression       GI/Hepatic Neg liver ROS,,,  Endo/Other  diabetes, Well Controlled, Gestational  Obesity  Renal/GU negative Renal ROS  negative genitourinary   Musculoskeletal negative musculoskeletal ROS (+)    Abdominal  (+) + obese  Peds  Hematology  (+) Blood dyscrasia, anemia   Anesthesia Other Findings   Reproductive/Obstetrics (+) Pregnancy                              Anesthesia Physical Anesthesia Plan  ASA: 2  Anesthesia Plan: Epidural   Post-op Pain Management: Minimal or no pain anticipated   Induction: Intravenous  PONV Risk Score and Plan:   Airway Management Planned: Natural Airway  Additional Equipment: Fetal Monitoring and None  Intra-op Plan:   Post-operative Plan:   Informed Consent: I have reviewed the patients History and Physical, chart, labs and discussed the procedure including the risks, benefits and alternatives for the proposed anesthesia with the patient or authorized representative who has indicated his/her understanding and acceptance.       Plan Discussed with: Anesthesiologist  Anesthesia Plan Comments:          Anesthesia Quick Evaluation

## 2023-10-08 NOTE — Progress Notes (Signed)
 Patient ID: Angela Shepard, female   DOB: June 06, 1996, 28 y.o.   MRN: [id number]  In to introduce myself to pt and family; comfortable w epidural; asymptomatic for pre-e; had some ^BPs during first four hours of admission  BPs since 0630 have been nl FHR 135-140s, +accels, occ mi variables Ctx q 2-3 mins Cx deferred (was 7/80/vtx 0/-1 by RN @ [phone])  CBG: 85 CMET/CBC: neg for pre-e P/C: 0.43 (had ROM prior to collection)  IUP@38 .3wks SOL A1GDM Labile BPs (possibly during ctx)  -Will plan to check cx in next 2hrs or w symptoms -Continue to monitor BPs- they have been normal since she has been comfortable, and the elevated P/C ratio was in the setting of ROM so it is most likely falsely elevated -Anticipate vag delivery  Jolayne Natter CNM 10/08/2023

## 2023-10-08 NOTE — Lactation Note (Signed)
 This note was copied from a baby's chart. Lactation Consultation Note  Patient Name: Angela Shepard [id number] Date: 10/08/2023 Age:28 hours Reason for consult: Initial assessment;Primapara;1st time breastfeeding;Early term 37-38.6wks;Infant < 6lbs;Maternal endocrine disorder  P1- Infant was born at [redacted]w[redacted]d weighing 2690g. [name] plans to offer both breast milk and formula for feedings. When LC entered the room, her RN was formula feeding infant due to a low blood sugar. LC reviewed how this can be common with GDM infants and low birth weight infants. [name] reports that infant has been latching since birth with no problems. [name] was hoping that someone would go into her room to tell her when to fee infant. LC encouraged [name] to set an alarm for every 3 hrs instead. Since LC was unable to assess a latch, LC encouraged [name] to call for a feeding so LC could perform a latch assessment. [name] declined having a DEBP set up at this time. [name] stated that we could reassess how things are going tomorrow. [name] denies having further questions or concerns at this time.  LC reviewed the first 24 hr birthday nap, day 2 cluster feeding, feeding infant on cue 8-12x in 24 hrs, not allowing infant to go over 3 hrs without a feeding, CDC milk storage guidelines, LC services handout and engorgement/breast care. LC encouraged [name] to call for further assistance as needed.  Maternal Data Has patient been taught Hand Expression?: No Does the patient have breastfeeding experience prior to this delivery?: No  Feeding Mother's Current Feeding Choice: Breast Milk and Formula Nipple Type: Slow - flow  Lactation Tools Discussed/Used Pump Education: Milk Storage  Interventions Interventions: Breast feeding basics reviewed;Education;LC Services brochure  Discharge Discharge Education: Engorgement and breast care;Warning signs for feeding baby Pump: DEBP;Manual;Hands Free;Personal ([name] reports that she has 4 pumps at home)  Consult  Status Consult Status: Follow-up Date: 10/09/23 Follow-up type: In-patient    Vernette Goo BS, IBCLC 10/08/2023, 7:36 PM

## 2023-10-08 NOTE — Discharge Summary (Signed)
 Postpartum Discharge Summary  Date of Service updated 10/10/2023     Patient Name: Angela Shepard DOB: 11/18/1995 MRN: [id number]  Date of admission: 10/08/2023 Delivery date:10/08/2023 Delivering provider: Hermann Look D Date of discharge: 10/10/2023  Admitting diagnosis: Normal labor [O80, Z37.9] Intrauterine pregnancy: [redacted]w[redacted]d     Secondary diagnosis:  Principal Problem:   NSVD (normal spontaneous vaginal delivery) Active Problems:   Gestational diabetes   Gestational hypertension  Additional problems: none    Discharge diagnosis: Term Pregnancy Delivered and GDM A1                                              Post partum procedures: none Augmentation: Pitocin  Complications: None  Hospital course: Onset of Labor With Vaginal Delivery      28 y.o. yo G1P1001 at [redacted]w[redacted]d was admitted in Latent Labor on 10/08/2023. Labor course was complicated by some BP elevations that normalized intrapartum; will continue to follow PP (bloodwork neg for pre-e; P/C falsely elevated due to amniotic fluid).  Membrane Rupture Time/Date: 1:00 AM,10/08/2023  Delivery Method:Vaginal, Spontaneous Operative Delivery:N/A Episiotomy: None Lacerations:  Vaginal Patient had a postpartum course complicated by persistent gestational hypertension, started on Nifedipine , Lasix  and potassium.  She is ambulating, tolerating a regular diet, passing flatus, and urinating well. Her PPD#2 fasting CBG was 80.  Patient is discharged home in stable condition on 10/10/23.  Newborn Data: Birth date:10/08/2023 Birth time:2:44 PM Gender:Female Living status:Living Apgars:9 ,9  Weight:2690 g (5lb 14.9oz)  Magnesium Sulfate received: No BMZ received: No Rhophylac:N/A MMR:N/A T-DaP: declined prenatally Flu: No RSV Vaccine received: No Transfusion:No  Immunizations received: Immunization History  Administered Date(s) Administered   Influenza,inj,Quad PF,6+ Mos 05/12/2021   Moderna Sars-Covid-2 Vaccination  03/25/2020, 04/24/2020   Tdap 10/15/2020    Physical exam  Vitals:   10/09/23 1347 10/09/23 1603 10/09/23 2016 10/10/23 0456  BP: (!) 131/91 126/81 118/86 131/83  Pulse: 81 96 85 90  Resp: 18  19 16   Temp: 97.8 F (36.6 C)  98.7 F (37.1 C)   TempSrc: Oral  Oral   SpO2: 100%   99%  Weight:      Height:       General: alert, cooperative, and no distress Lochia: appropriate Uterine Fundus: firm Incision: N/A DVT Evaluation: No evidence of DVT seen on physical exam. Negative Homan's sign. No cords or calf tenderness. Labs: Lab Results  Component Value Date   WBC 12.2 (H) 10/08/2023   HGB 10.0 (L) 10/08/2023   HCT 31.5 (L) 10/08/2023   MCV 91.3 10/08/2023   PLT 237 10/08/2023      Latest Ref Rng & Units 10/08/2023    3:40 AM  CMP  Glucose 70 - 99 mg/dL 89   BUN 6 - 20 mg/dL 8   Creatinine 4.09 - 8.11 mg/dL 9.14   Sodium 782 - 956 mmol/L 136   Potassium 3.5 - 5.1 mmol/L 3.7   Chloride 98 - 111 mmol/L 105   CO2 22 - 32 mmol/L 20   Calcium 8.9 - 10.3 mg/dL 9.3   Total Protein 6.5 - 8.1 g/dL 6.1   Total Bilirubin 0.0 - 1.2 mg/dL 0.5   Alkaline Phos 38 - 126 U/L 98   AST 15 - 41 U/L 25   ALT 0 - 44 U/L 16    Edinburgh Score:    10/10/2023  12:30 AM  Edinburgh Postnatal Depression Scale Screening Tool  I have been able to laugh and see the funny side of things. 0  I have looked forward with enjoyment to things. 0  I have blamed myself unnecessarily when things went wrong. 0  I have been anxious or worried for no good reason. 2  I have felt scared or panicky for no good reason. 1  Things have been getting on top of me. 1  I have been so unhappy that I have had difficulty sleeping. 0  I have felt sad or miserable. 0  I have been so unhappy that I have been crying. 0  The thought of harming myself has occurred to me. 0  Edinburgh Postnatal Depression Scale Total 4   Edinburgh Postnatal Depression Scale Total: 4   After visit meds:  Allergies as of 10/10/2023        Reactions   Amoxicillin    Penicillins Rash   Did it involve swelling of the face/tongue/throat, SOB, or low BP? Unknown Did it involve sudden or severe rash/hives, skin peeling, or any reaction on the inside of your mouth or nose? Unknown Did you need to seek medical attention at a hospital or doctor's office? Unknown When did it last happen?    Childhood   If all above answers are "NO", may proceed with cephalosporin use.        Medication List     STOP taking these medications    Accu-Chek Guide Test test strip Generic drug: glucose blood   Accu-Chek Guide w/Device Kit   Accu-Chek Softclix Lancets lancets       TAKE these medications    acetaminophen  325 MG tablet Commonly known as: Tylenol  Take 2 tablets (650 mg total) by mouth every 4 (four) hours as needed (for pain scale < 4).   coconut oil Oil Apply 1 Application topically as needed.   furosemide  20 MG tablet Commonly known as: LASIX  Take 1 tablet (20 mg total) by mouth daily for 5 days.   ibuprofen  600 MG tablet Commonly known as: ADVIL  Take 1 tablet (600 mg total) by mouth every 6 (six) hours.   NIFEdipine  30 MG 24 hr tablet Commonly known as: ADALAT  CC Take 1 tablet (30 mg total) by mouth daily.   norethindrone  0.35 MG tablet Commonly known as: Ortho Micronor  Take 1 tablet (0.35 mg total) by mouth daily.   PRENATAL VITAMIN PO Take 1 tablet by mouth daily.   witch hazel-glycerin  pad Commonly known as: TUCKS Apply 1 Application topically as needed for hemorrhoids.         Discharge home in stable condition Infant Feeding: Bottle Infant Disposition:home with mother Discharge instruction: per After Visit Summary and Postpartum booklet. Activity: Advance as tolerated. Pelvic rest for 6 weeks.  Diet: routine diet Future Appointments: Future Appointments  Date Time Provider Department Center  10/12/2023  9:00 AM [facility] NURSE [facility] None  11/20/2023  9:15 AM Constant, Carles Cheadle, MD [facility]  None   Follow up Visit:  Follow-up Information     [facility] [place]. Schedule an appointment as soon as possible for a visit in 1 week(s).   Specialty: Obstetrics and Gynecology Why: Post partum blood pressure check in 1 week Contact information: [address], [address] [place]  [phone]        [facility] for [facility] at [place]. Schedule an appointment as soon as possible for a visit.  Specialty: Obstetrics and Gynecology Why: Routine post partum and glucose testing visit in 4-6 weeks Contact information: [address], [address] [place]  [address] [phone]                Jolayne Natter, CNM  P [facility] Please schedule this patient for Postpartum visit in: 6 weeks with the following provider: Any provider In-Person For C/S patients schedule nurse incision check in weeks 2 weeks: no High risk pregnancy complicated by: A1GDM Delivery mode:  SVD Anticipated Birth Control:  other/unsure PP Procedures needed: RN BP check 1wk (some ^BP in labor) Schedule Integrated BH visit: no   10/10/2023 Darrow End, MD

## 2023-10-08 NOTE — MAU Note (Signed)
.  Angela Shepard is a 28 y.o. at [redacted]w[redacted]d here in MAU reporting:   PROM at 0100. Pt states baby kicked and she felt a huge gush of fluid come out. Clear, no bleeding. +FM.  Ctxs are every 10 min apart.    Pain score:7/10 lower abdominal ctxs GDM diet control.  GBS neg.  1/60/-2 on 10/05/23 Desires epidural.     Vitals:   10/08/23 0231  BP: (!) 141/84  Pulse: 83  Resp: 16  Temp: 98.4 F (36.9 C)  SpO2: 98%     FHT: 149  Lab orders placed from triage: Korene Pert slide

## 2023-10-08 NOTE — Anesthesia Procedure Notes (Signed)
 Epidural Patient location during procedure: OB Start time: 10/08/2023 6:22 AM End time: 10/08/2023 6:31 AM  Staffing Anesthesiologist: Tura Gaines, MD Performed: anesthesiologist   Preanesthetic Checklist Completed: patient identified, IV checked, site marked, risks and benefits discussed, surgical consent, monitors and equipment checked, pre-op evaluation and timeout performed  Epidural Patient position: sitting Prep: DuraPrep and site prepped and draped Patient monitoring: continuous pulse ox and blood pressure Approach: midline Location: L3-L4 Injection technique: LOR air  Needle:  Needle type: Tuohy  Needle gauge: 17 G Needle length: 9 cm and 9 Needle insertion depth: 5 cm Catheter type: closed end flexible Catheter size: 19 Gauge Catheter at skin depth: 10 cm Test dose: negative and Other  Assessment Events: blood not aspirated, no cerebrospinal fluid, injection not painful, no injection resistance, no paresthesia and negative IV test  Additional Notes Patient identified. Risks and benefits discussed including failed block, incomplete  Pain control, post dural puncture headache, nerve damage, paralysis, blood pressure Changes, nausea, vomiting, reactions to medications-both toxic and allergic and post Partum back pain. All questions were answered. Patient expressed understanding and wished to proceed. Sterile technique was used throughout procedure. Epidural site was Dressed with sterile barrier dressing. No paresthesias, signs of intravascular injection Or signs of intrathecal spread were encountered.  Patient was more comfortable after the epidural was dosed. Please see RN's note for documentation of vital signs and FHR which are stable. Reason for block:procedure for pain

## 2023-10-09 LAB — GLUCOSE, CAPILLARY: Glucose-Capillary: 84 mg/dL (ref 70–99)

## 2023-10-09 NOTE — Lactation Note (Signed)
 This note was copied from a baby's chart. Lactation Consultation Note  Patient Name: Angela Shepard [id number] Date: 10/09/2023 Age:28 hours Reason for consult: Follow-up assessment;Primapara;1st time breastfeeding;Early term 37-38.6wks;Infant < 6lbs;Maternal endocrine disorder  P1- [name] reports that infant has been nursing for about 15-20 minutes with every feeding, then she supplements with formula because he is low weight and very hungry. LC praised [name] for continuing to offer the breast first as previously discussed. [name] denies having any concerns over feedings. [name] reported that she has not slept since she came into the hospital and she would really like for people to not come into her room as much as possible. [name] also stressed how much she would like to go home tomorrow. LC informed her RN of [name]'s request to have as little sleep interruptions as possible. LC reviewed day 2 cluster feeding and what to expect tonight. LC encouraged [name] to call for further assistance as needed.   Maternal Data Has patient been taught Hand Expression?: No Does the patient have breastfeeding experience prior to this delivery?: No  Feeding Mother's Current Feeding Choice: Breast Milk and Formula Nipple Type: Nfant Standard Flow (white)  Lactation Tools Discussed/Used Pump Education: Milk Storage  Interventions Interventions: Breast feeding basics reviewed;Education;LC Services brochure  Discharge Discharge Education: Engorgement and breast care;Warning signs for feeding baby Pump: DEBP;Manual;Hands Free;Personal  Consult Status Consult Status: Follow-up Date: 10/10/23 Follow-up type: In-patient    Vernette Goo BS, IBCLC 10/09/2023, 9:40 PM

## 2023-10-09 NOTE — Progress Notes (Signed)
   10/09/23 1244 10/09/23 1347  Vital Signs  BP (!) 120/91 (!) 131/91 (nurse notified)  BP Location  --  Right Arm  Patient Position (if appropriate)  --  High-fowlers  BP Method  --  Automatic  Pulse Rate  --  81  ECG Heart Rate 85  --   Pulse Rate Source  --  Dinamap  Resp  --  18  Temp  --  97.8 F (36.6 C)  Temp Source  --  Oral  Oxygen Therapy  SpO2  --  100 %   MD Constant notified headache and plus 2 reflexes. MD stated continue to monitor for now

## 2023-10-09 NOTE — Progress Notes (Signed)
 POSTPARTUM PROGRESS NOTE  Post Partum Day 1  Subjective:  Angela Shepard is a 28 y.o. G1P1001 s/p SVD at [redacted]w[redacted]d.  She reports she is doing well. No acute events overnight. She denies any problems with ambulating, voiding or po intake. Denies nausea or vomiting.  Pain is well controlled.  Lochia is appropriate.  Objective: Blood pressure 106/79, pulse 94, temperature 99.9 F (37.7 C), temperature source Oral, resp. rate 17, height 5\' 3"  (1.6 m), weight 82.6 kg, last menstrual period 01/12/2023, SpO2 99%, unknown if currently breastfeeding.  Physical Exam:  General: alert, cooperative and no distress Chest: no respiratory distress Heart:regular rate, distal pulses intact Uterine Fundus: firm, appropriately tender DVT Evaluation: No calf swelling or tenderness Extremities: No edema Skin: warm, dry  Recent Labs    10/08/23 0340  HGB 10.0*  HCT 31.5*    Assessment/Plan: Angela Shepard is a 28 y.o. G1P1001 s/p SVD at [redacted]w[redacted]d   PPD#1 - Doing well  Routine postpartum care  Gestational hypertension - Continue Procardia, Lasix, K  A1GDM - Fasting CBG ok  needs PP 2h GTT  Contraception: Unsure Feeding: Both Dispo: Plan for discharge tomorrow.   LOS: 1 day   Melanie Spires, MD OB Fellow  10/09/2023, 6:03 AM

## 2023-10-09 NOTE — Clinical Social Work Maternal (Signed)
 CLINICAL SOCIAL WORK MATERNAL/CHILD NOTE  Patient Details  Name: Angela Shepard MRN: [id number] Date of Birth: [birth year]  Date:  10/09/2023  Clinical Social Worker Initiating Note:  Kallyn Demarcus Date/Time: Initiated:  10/09/23/1440     Child's Name:  Angela Shepard   Biological Parents:  Mother, Father Angela Shepard Feb 27, 1996, Angela Shepard 06/11/1991)   Need for Interpreter:  None   Reason for Referral:  Current Substance Use/Substance Use During Pregnancy     Address:  [address] [place] [address]    Phone number:  [phone] (home)     Additional phone number:   Household Members/Support Persons (HM/SP):   Household Member/Support Person 1   HM/SP Name Relationship DOB or Age  HM/SP -1 Angela Shepard FOB 06/11/1991  HM/SP -2        HM/SP -3        HM/SP -4        HM/SP -5        HM/SP -6        HM/SP -7        HM/SP -8          Natural Supports (not living in the home):  Parent   Professional Supports: None   Employment: Full-time   Type of Work: Merchandiser, retail   Education:  High school graduate   Homebound arranged:    Surveyor, quantity Resources:  Medicaid   Other Resources:  [facility]   Cultural/Religious Considerations Which May Impact Care:    Strengths:  Home prepared for child  , Ability to meet basic needs  , Pediatrician chosen   Psychotropic Medications:         [place]ediatrician:    [place]  Pediatrician List: CSW received a consult for [facility] use during pregnancy. CSW met with [name] to complete assessment and offer support. CSW entered the room and observed [name] resting in bed, FOB at bedside and maternal grandmother at bedside holding the infant. CSW introduced self, CSW role and reason for visit. [name] was agreeable to visit and allowed family to remain in the room. CSW inquired about how [name] was feeling, [name] reported she was exhausted. CSW confirmed [name]'s address and phone number, [name] verified the information on file was  correct.   CSW inquired about [name] MH hx, [name] reported she has been diagnosed with Anxiety, ADHD and ADD. CSW inquired about treatment. [name] reported she was taking an Anxiety and depression medication prior to pregnancy but stopped once she found out she was pregnant. [name] reported a stable mood throughout pregnancy and stated she will be restarting her medication now that she has delivered. CSW provided education regarding the baby blues period vs. perinatal mood disorders, discussed treatment and gave resources for mental health follow up if concerns arise.  CSW recommends self-evaluation during the postpartum time period using the New Mom Checklist from Postpartum Progress and encouraged [name] to contact a medical professional if symptoms are noted at any time. [name] identified her mom and FOB as her primary supports.   CSW inquired about [name] THC use, [name] stated she used THC during pregnancy to help with nausea. CSW inquired about [name] last use, [name] could not remember her last use. CSW explained the hospital drug screen policy, [name] verbalized understanding. CSW informed [name] infants UDS was negative and CDS was pending and if warranted a CPS report would be made, [name] verbalized understanding.   CSW provided review of Sudden Infant Death Syndrome (SIDS) precautions.  [name] reported she as  all necessary items for the infant including a bassinet and car seat.  CSW identifies no further need for intervention and no barriers to discharge at this time.  CSW Plan/Description:  No Further Intervention Required/No Barriers to Discharge, Sudden Infant Death Syndrome (SIDS) Education, Perinatal Mood and Anxiety Disorder (PMADs) Education, CSW Will Continue to Monitor Umbilical Cord Tissue Drug Screen Results and Make Report if [facility] Drug Screen Policy Information    [name], [place] 10/09/2023, 2:54 PM

## 2023-10-09 NOTE — Anesthesia Postprocedure Evaluation (Cosign Needed)
 Anesthesia Post Note  Patient: Angela Shepard  Procedure(s) Performed: AN AD HOC LABOR EPIDURAL     Anesthesia Type: Epidural Level of consciousness: awake and alert and oriented Pain management: pain level controlled Vital Signs Assessment: vitals unstable Postop Assessment: patient able to bend at knees, no apparent nausea or vomiting, no backache and able to ambulate Anesthetic complications: no   No notable events documented.  Last Vitals:  Vitals:   10/09/23 0453 10/09/23 0839  BP: 106/79 122/84  Pulse: 94 89  Resp: 17   Temp:  37.2 C  SpO2: 99% 99%    Last Pain:  Vitals:   10/09/23 0818  TempSrc:   PainSc: 7    Pain Goal:                Epidural/Spinal Function Cutaneous sensation: Normal sensation (10/09/23 0839), Patient able to flex knees: Yes (10/09/23 0839), Patient able to lift hips off bed: Yes (10/09/23 0839), Back pain beyond tenderness at insertion site: No (10/09/23 0839), Progressively worsening motor and/or sensory loss: No (10/09/23 0839), Bowel and/or bladder incontinence post epidural: No (10/09/23 0839)  Glo Larch

## 2023-10-10 ENCOUNTER — Other Ambulatory Visit (HOSPITAL_COMMUNITY): Payer: Self-pay

## 2023-10-10 LAB — BIRTH TISSUE RECOVERY COLLECTION (PLACENTA DONATION)

## 2023-10-10 LAB — GLUCOSE, CAPILLARY: Glucose-Capillary: 80 mg/dL (ref 70–99)

## 2023-10-10 MED ORDER — NORETHINDRONE 0.35 MG PO TABS
1.0000 | ORAL_TABLET | Freq: Every day | ORAL | 11 refills | Status: DC
  Filled 2023-10-10: qty 28, 28d supply, fill #0

## 2023-10-10 MED ORDER — FUROSEMIDE 20 MG PO TABS
20.0000 mg | ORAL_TABLET | Freq: Every day | ORAL | 0 refills | Status: DC
  Filled 2023-10-10: qty 5, 5d supply, fill #0

## 2023-10-10 MED ORDER — NIFEDIPINE ER 30 MG PO TB24
30.0000 mg | ORAL_TABLET | Freq: Every day | ORAL | 0 refills | Status: DC
Start: 2023-10-10 — End: 2023-11-15
  Filled 2023-10-10: qty 30, 30d supply, fill #0

## 2023-10-10 MED ORDER — COCONUT OIL OIL: 1.0000 | TOPICAL_OIL | Status: DC | PRN

## 2023-10-10 MED ORDER — WITCH HAZEL-GLYCERIN EX PADS: 1.0000 | MEDICATED_PAD | CUTANEOUS | Status: DC | PRN

## 2023-10-10 MED ORDER — IBUPROFEN 600 MG PO TABS
600.0000 mg | ORAL_TABLET | Freq: Four times a day (QID) | ORAL | 0 refills | Status: DC
  Filled 2023-10-10: qty 30, 8d supply, fill #0

## 2023-10-10 MED ORDER — ACETAMINOPHEN 325 MG PO TABS: 650.0000 mg | ORAL_TABLET | ORAL | Status: DC | PRN

## 2023-10-10 NOTE — Lactation Note (Signed)
 This note was copied from a baby's chart. Lactation Consultation Note  Patient Name: Angela Shepard Date: 10/10/2023 Age:28 hours Reason for consult: Follow-up assessment;Maternal discharge;Early term 37-38.6wks  P1, 38 wks, @ 43 hrs of life. Discharge anticipated today. Encouraged mom in starting on breast, supplementation after if felt needed. Encouraged starting with hand expression, and using breast compression through feeding to keep baby interested in breast.  Encouraged mom to keep working on big mouth latch with baby and use EBM or coconut oil after each feed. Discussed cluster feeding overnight/ early morning brings in our milk supply, shared expectations of milk coming in. Highlighted risk of engorgement. Discussed hand pump/express to soften breasts, motrin  as anti-inflammatory, and ice packs for 10-20 minutes post feed/pumping if still over-full is the best treatments for inflamed/engorged breasts. Hand pump provided with variety of flanges- discussed sizing dynamic. Highlighted flange kits on internet for [employer] .  Feeding Mother's Current Feeding Choice: Breast Milk and Formula Nipple Type: Nfant Standard Flow (white)   Interventions Interventions: Breast feeding basics reviewed;Hand express;Breast compression;Expressed milk;Coconut oil;Hand pump;Education;LC Services brochure;CDC milk storage guidelines  Discharge Discharge Education: Engorgement and breast care Pump: DEBP;Manual;Hands Free;Personal  Consult Status Consult Status: Complete Date: 10/10/23 Follow-up type: In-patient    [facility] 10/10/2023, 10:21 AM

## 2023-10-10 NOTE — Patient Instructions (Signed)
 Your appointment with Outpatient Lactation is: May 15 th at 3:30 pm [facility] (First Floor) [address] [place]  Check in under baby's name.  Please bring your baby hungry along with your pump and a bottle of either formula or expressed breast milk. Please also bring your pump flanges and we welcome support people! If you need lactation assistance before your appointment, please call [phone] and press 4 for lactation.

## 2023-10-12 ENCOUNTER — Inpatient Hospital Stay (HOSPITAL_COMMUNITY): Admission: RE | Admit: 2023-10-12 | Source: Home / Self Care | Admitting: Obstetrics and Gynecology

## 2023-10-12 ENCOUNTER — Ambulatory Visit (INDEPENDENT_AMBULATORY_CARE_PROVIDER_SITE_OTHER)

## 2023-10-12 ENCOUNTER — Inpatient Hospital Stay (HOSPITAL_COMMUNITY)

## 2023-10-12 VITALS — BP 125/88 | HR 79 | Wt 160.0 lb

## 2023-10-12 DIAGNOSIS — Z0130 Encounter for examination of blood pressure without abnormal findings: Secondary | ICD-10-CM

## 2023-10-12 NOTE — Progress Notes (Signed)
 Subjective:  Angela Shepard is a 28 y.o. female here for BP check.   Hypertension ROS: taking medications as instructed, no medication side effects noted, no TIA's, no chest pain on exertion, no dyspnea on exertion, and no swelling of ankles.    Objective:  BP 125/88   Pulse 79   LMP 01/12/2023 (Exact Date)   Appearance alert, well appearing, and in no distress. General exam BP noted to be well controlled today in office.    Assessment:   Blood Pressure well controlled.   Plan:  Current treatment plan is effective, no change in therapy.Aaron Aas

## 2023-10-13 ENCOUNTER — Encounter: Payer: Self-pay | Admitting: Obstetrics and Gynecology

## 2023-10-13 ENCOUNTER — Telehealth: Payer: Self-pay

## 2023-10-13 ENCOUNTER — Encounter: Payer: Self-pay | Admitting: Physician Assistant

## 2023-10-13 NOTE — Telephone Encounter (Signed)
 S/w pt and she stated that stinging feeling comes when urine is hitting her vaginal area while urinating. Advised pt that this is normal after delivery and having a tear, advised to continue ibuprofen , peri bottle, dermoplast, and follow up if symptoms get worse.

## 2023-11-07 ENCOUNTER — Encounter: Payer: Self-pay | Admitting: Physician Assistant

## 2023-11-08 ENCOUNTER — Encounter

## 2023-11-08 ENCOUNTER — Ambulatory Visit (INDEPENDENT_AMBULATORY_CARE_PROVIDER_SITE_OTHER): Admitting: Family Medicine

## 2023-11-08 ENCOUNTER — Telehealth: Payer: Self-pay | Admitting: *Deleted

## 2023-11-08 ENCOUNTER — Encounter: Payer: Self-pay | Admitting: Family Medicine

## 2023-11-08 VITALS — Ht 63.0 in

## 2023-11-08 DIAGNOSIS — F411 Generalized anxiety disorder: Secondary | ICD-10-CM | POA: Diagnosis not present

## 2023-11-08 DIAGNOSIS — G43909 Migraine, unspecified, not intractable, without status migrainosus: Secondary | ICD-10-CM | POA: Diagnosis not present

## 2023-11-08 MED ORDER — PROPRANOLOL HCL ER 60 MG PO CP24: 60.0000 mg | ORAL_CAPSULE | Freq: Every day | ORAL | 2 refills | Status: DC

## 2023-11-08 MED ORDER — ELETRIPTAN HYDROBROMIDE 40 MG PO TABS: 40.0000 mg | ORAL_TABLET | ORAL | 0 refills | Status: DC | PRN

## 2023-11-08 NOTE — Assessment & Plan Note (Signed)
 Had issues with migraines prior to pregnancy, stopped taking propranolol  during pregnancy. Has not resumed.  Not breastfeeding.  Used relpax  prn.  Restarting propranolol  and relpax .  Her BP was normal and she has not been checking it at home so she does not know if it has been high since d/c from hospital. No other symptoms beyond headache except for mild 'dizziness' yesterday. Low concern for pre-eclampsia.

## 2023-11-08 NOTE — Progress Notes (Unsigned)
   Acute Office Visit  Subjective:     Patient ID: Angela Shepard, female    DOB: 1995-07-15, 28 y.o.   MRN: [id number]  Chief Complaint  Patient presents with   Hypertension   Headache    HPI Subjective - Postpartum headaches: Started last week, worse past 2 days, slightly improved today - History of headaches prior to pregnancy - Denies visual changes with headaches - Reports dizziness yesterday, improved with ibuprofen  and nifedipine  - Delivered baby boy Angela Shepard) on 10/08/2023, now 47 month old - Not breastfeeding - No postpartum depression symptoms - Discontinued all medications when discovered pregnancy at 1 month gestation - History of anxiety, depression, ADHD/ADD  Medications Discontinued during pregnancy: propranolol , Zoloft , buspirone , hydroxyzine , Relpax . Postpartum: prescribed nifedipine  and furosemide  for blood pressure, has been taking nifedipine  intermittently when concerned about blood pressure, last dose >24 hours ago.  PMH: Anxiety, depression, ADHD/ADD, headaches. PSH: Recent vaginal delivery 10/08/2023. FH: None mentioned. Social Hx: Works as Museum/gallery conservator, has two Glass blower/designer.  ROS: Denies visual changes, reports dizziness yesterday that improved with medication.   ROS      Objective:    Ht 5\' 3"  (1.6 m)   LMP 01/12/2023 (Exact Date)   BMI 28.34 kg/m  {Vitals History (Optional):23777}  Physical Exam Gen: alert, oriented Cv: rrr Pulm: lctab Neuro: cn 2-12 grossly intact.  Strength equal, normal gait.   No results found for any visits on 11/08/23.      Assessment & Plan:   Episodic migraine Assessment & Plan: Had issues with migraines prior to pregnancy, stopped taking propranolol  during pregnancy. Has not resumed.  Not breastfeeding.  Used relpax  prn.  Restarting propranolol  and relpax .  Her BP was normal and she has not been checking it at home so she does not know if it has been high since d/c from hospital. No other symptoms beyond headache except  for mild 'dizziness' yesterday. Low concern for pre-eclampsia.     Generalized anxiety disorder Assessment & Plan: Stopped zoloft  during first month of pregnancy.  She has not resumed.  Pt will continue to hold off at this time.  Reassess at future visit   Other orders -     Propranolol  HCl ER; Take 1 capsule (60 mg total) by mouth daily.  Dispense: 30 capsule; Refill: 2 -     Eletriptan  Hydrobromide; Take 1 tablet (40 mg total) by mouth as needed for migraine or headache. May repeat in 2 hours if headache persists or recurs.  Dispense: 10 tablet; Refill: 0     No follow-ups on file.  Laneta Pintos, MD

## 2023-11-08 NOTE — Telephone Encounter (Signed)
 Copied from CRM [id number]. Topic: Appointments - Appointment Scheduling >> Nov 08, 2023 11:10 AM [place] wrote: Patient wants to have an appt for blood pressure check due to high bp during pregnancy. Delivered a month ago.  Please call [phone]

## 2023-11-08 NOTE — Telephone Encounter (Signed)
Pt had appointment in office today.  

## 2023-11-08 NOTE — Assessment & Plan Note (Signed)
 Stopped zoloft  during first month of pregnancy.  She has not resumed.  Pt will continue to hold off at this time.  Reassess at future visit

## 2023-11-15 ENCOUNTER — Ambulatory Visit (INDEPENDENT_AMBULATORY_CARE_PROVIDER_SITE_OTHER)

## 2023-11-15 VITALS — BP 113/75 | HR 78 | Ht 63.0 in | Wt 161.0 lb

## 2023-11-15 DIAGNOSIS — N6312 Unspecified lump in the right breast, upper inner quadrant: Secondary | ICD-10-CM | POA: Insufficient documentation

## 2023-11-15 DIAGNOSIS — Z13228 Encounter for screening for other metabolic disorders: Secondary | ICD-10-CM

## 2023-11-15 DIAGNOSIS — Z6828 Body mass index (BMI) 28.0-28.9, adult: Secondary | ICD-10-CM

## 2023-11-15 DIAGNOSIS — G43909 Migraine, unspecified, not intractable, without status migrainosus: Secondary | ICD-10-CM

## 2023-11-15 DIAGNOSIS — E559 Vitamin D deficiency, unspecified: Secondary | ICD-10-CM | POA: Diagnosis not present

## 2023-11-15 DIAGNOSIS — F411 Generalized anxiety disorder: Secondary | ICD-10-CM

## 2023-11-15 DIAGNOSIS — Z130 Encounter for screening for diseases of the blood and blood-forming organs and certain disorders involving the immune mechanism: Secondary | ICD-10-CM | POA: Diagnosis not present

## 2023-11-15 DIAGNOSIS — R7301 Impaired fasting glucose: Secondary | ICD-10-CM

## 2023-11-15 DIAGNOSIS — Z1329 Encounter for screening for other suspected endocrine disorder: Secondary | ICD-10-CM | POA: Diagnosis not present

## 2023-11-15 DIAGNOSIS — Z8632 Personal history of gestational diabetes: Secondary | ICD-10-CM

## 2023-11-15 NOTE — Assessment & Plan Note (Signed)
 Will check vitamin D  level today and treat deficiency as needed.

## 2023-11-15 NOTE — Patient Instructions (Signed)
 It was nice to see you today!  As we discussed in clinic:   -Continue taking your triptan and propanolol  as prescribed for headaches.  -Continue taking your oral birth control, aiming for around the same time every day. It may help to tack this task on with another activity (such as brushing your teeth) or set an alarm on your phone to remember when to take it.  -We are getting blood work from you today. You will receive a MyChart message from me within 24-48 hours explaining the results. -I have placed an order for an ultrasound of your right breast to ensure that the lump is nothing sinister. The lump is soft and mobile, which is a reassuring sign that it is likely a fibroadeomna (benign breast cyst) or a swollen milk duct (also benign). I will send a message to you explaining the ultrasound results.  -If you begin to notice that your breast becomes red, warm, swollen, or tender, call the office so we can get you started on an antibiotic. -I will plan to see you for your yearly physical, or sooner as needed!   If you have any problems before your next visit feel free to message me via MyChart (minor issues or questions) or call the office, otherwise you may reach out to schedule an office visit.  Thank you! Meryl Acosta, PA-C

## 2023-11-15 NOTE — Assessment & Plan Note (Addendum)
 Discussed with patient the likelihood of fibroadenoma versus clogged milk duct.  Discussed the symptoms of mastitis with the patient including redness, tenderness, swelling, discharge, fevers.  Advised her that if any of the symptoms do arise to call the office for further evaluation and antibiotics.  Order for an ultrasound of the right breast placed today to rule out sinister causes of the mass.  Will follow-up with patient when results from ultrasound come back.

## 2023-11-15 NOTE — Progress Notes (Signed)
 Complete physical exam  Patient: Angela Shepard   DOB: 08/02/1995   27 y.o. Female  MRN: [id number]  Subjective:     Chief Complaint  Patient presents with   Annual Exam    Establishing Care    Angela Shepard is a very pleasant 28 y.o. female who presents today for a complete physical exam. She reports consuming a general diet. The patient does not participate in regular exercise at present. She generally feels well. She reports sleeping well. She does have additional problems to discuss today.   Patient recently delivered her son, Angela Shepard, in April.  She reports that she is doing well since delivery and has a follow-up with her OB next week.  She is not breast-feeding and is on oral birth control medication.  She reports that her menstrual cycles have not returned yet since delivery.  She reports that she is sleeping well, but sleep is interrupted due to having new baby at home.  She also reports that her headaches are well-controlled on the triptan and propranolol .  Reports that her moods are stable and she feels that she does not need to restart her SSRI at this time.   She also reports the discovery of a lump in her right breast.  She found this lump several days ago and wanted to have it evaluated.  No known family history of breast cancer.  She reports that the lump is not painful and has not changed in size since she discovered it.  Denies any associated redness, swelling, discharge, fevers.  Patient has also not had fasting labs in several months, so she is agreeable to updating them today.  Patient is up-to-date on all health screenings, including Pap smear, which she has been having them performed at this office.   Most recent fall risk assessment:    11/08/2023    1:19 PM  Fall Risk   Falls in the past year? 0  Number falls in past yr: 0  Injury with Fall? 0  Risk for fall due to : No Fall Risks  Follow up Falls evaluation completed     Most recent depression  screenings:    11/15/2023    9:57 AM 08/02/2023   11:07 AM  PHQ 2/9 Scores  PHQ - 2 Score 0 0  PHQ- 9 Score 2         Patient Care Team: Melene Sportsman as PCP - General (Physician Assistant) Verlyn Goad, MD as PCP - OBGYN (Obstetrics and Gynecology)   Outpatient Medications Prior to Visit  Medication Sig   eletriptan  (RELPAX ) 40 MG tablet Take 1 tablet (40 mg total) by mouth as needed for migraine or headache. May repeat in 2 hours if headache persists or recurs.   ibuprofen  (ADVIL ) 600 MG tablet Take 1 tablet (600 mg total) by mouth every 6 (six) hours.   norethindrone  (ORTHO MICRONOR ) 0.35 MG tablet Take 1 tablet (0.35 mg total) by mouth daily.   propranolol  ER (INDERAL  LA) 60 MG 24 hr capsule Take 1 capsule (60 mg total) by mouth daily.   acetaminophen  (TYLENOL ) 325 MG tablet Take 2 tablets (650 mg total) by mouth every 4 (four) hours as needed (for pain scale < 4). (Patient not taking: Reported on 11/15/2023)   coconut oil OIL Apply 1 Application topically as needed. (Patient not taking: Reported on 11/15/2023)   furosemide  (LASIX ) 20 MG tablet Take 1 tablet (20 mg total) by mouth daily for 5 days.   NIFEdipine  (  ADALAT  CC) 30 MG 24 hr tablet Take 1 tablet (30 mg total) by mouth daily. (Patient not taking: Reported on 11/15/2023)   Prenatal Vit-Fe Fumarate-FA (PRENATAL VITAMIN PO) Take 1 tablet by mouth daily. (Patient not taking: Reported on 11/15/2023)   witch hazel-glycerin  (TUCKS) pad Apply 1 Application topically as needed for hemorrhoids. (Patient not taking: Reported on 11/15/2023)   No facility-administered medications prior to visit.    ROS   As noted in HPI.     Objective:     BP 113/75   Pulse 78   Ht 5\' 3"  (1.6 m)   Wt 161 lb 0.6 oz (73 kg)   LMP 01/12/2023 (Exact Date)   SpO2 98%   Breastfeeding No   BMI 28.53 kg/m    Physical Exam Constitutional:      General: She is not in acute distress.    Appearance: Normal appearance.  Cardiovascular:      Rate and Rhythm: Normal rate and regular rhythm.     Heart sounds: Normal heart sounds. No murmur heard.    No friction rub. No gallop.  Pulmonary:     Effort: Pulmonary effort is normal. No respiratory distress.     Breath sounds: Normal breath sounds.  Chest:  Breasts:    Right: Mass (Soft, mobile, dime sized lump noted in the upper inner quadrant of right breast just superior to areola) present. No swelling, bleeding, inverted nipple, nipple discharge, skin change or tenderness.     Left: Normal. No swelling, bleeding, inverted nipple, mass, nipple discharge, skin change or tenderness.  Musculoskeletal:        General: No swelling.  Skin:    General: Skin is warm and dry.  Neurological:     General: No focal deficit present.     Mental Status: She is alert.  Psychiatric:        Mood and Affect: Mood normal.        Behavior: Behavior normal.        Thought Content: Thought content normal.      No results found for any visits on 11/15/23.     Assessment & Plan:    Routine Health Maintenance and Physical Exam  Immunization History  Administered Date(s) Administered   Influenza,inj,Quad PF,6+ Mos 05/12/2021   Moderna Sars-Covid-2 Vaccination 03/25/2020, 04/24/2020   Tdap 10/15/2020    Health Maintenance  Topic Date Due   COVID-19 Vaccine (3 - 2024-25 season) 02/12/2023   INFLUENZA VACCINE  01/12/2024   Cervical Cancer Screening (Pap smear)  09/21/2025   DTaP/Tdap/Td (2 - Td or Tdap) 10/16/2030   Hepatitis C Screening  Completed   HIV Screening  Completed   HPV VACCINES  Aged Out   Meningococcal B Vaccine  Aged Out    Discussed health benefits of physical activity, and encouraged her to engage in regular exercise appropriate for her age and condition.  Problem List Items Addressed This Visit       Cardiovascular and Mediastinum   Episodic migraine (Chronic)   Stable on eletriptan  40 mg as needed and propranolol  ER 60 mg daily for prophylaxis.         Endocrine   Impaired fasting glucose (Chronic)   Will check A1c with her other labs today.        Other   Generalized anxiety disorder (Chronic)   Stopped Zoloft  during her first month of pregnancy.  She has not resumed this medication and would like to continue to hold off at this time.  Will continue to monitor.      Vitamin D  deficiency (Chronic)   Will check vitamin D  level today and treat deficiency as needed.      Mass of upper inner quadrant of right breast (Chronic)   Discussed with patient the likelihood of fibroadenoma versus clogged milk duct.  Discussed the symptoms of mastitis with the patient including redness, tenderness, swelling, discharge, fevers.  Advised her that if any of the symptoms do arise to call the office for further evaluation and antibiotics.  Order for an ultrasound of the right breast placed today to rule out sinister causes of the mass.  Will follow-up with patient when results from ultrasound come back.      Relevant Orders   US  LIMITED ULTRASOUND INCLUDING AXILLA RIGHT BREAST   Other Visit Diagnoses       Screening for deficiency anemia    -  Primary   Relevant Orders   CBC w/Diff     Screening for thyroid  disorder       Relevant Orders   TSH     Vitamin D  insufficiency       Relevant Orders   Vitamin D  (25 hydroxy)     Screening for metabolic disorder       Relevant Orders   Comp Met (CMET)     History of gestational diabetes       Relevant Orders   Lipid Profile   HgB A1c     BMI 28.0-28.9,adult       Relevant Orders   HgB A1c      Return in about 1 year (around 11/14/2024) for Physical.      Odilia Bennett, PA-C

## 2023-11-15 NOTE — Assessment & Plan Note (Signed)
 Stopped Zoloft  during her first month of pregnancy.  She has not resumed this medication and would like to continue to hold off at this time.  Will continue to monitor.

## 2023-11-15 NOTE — Assessment & Plan Note (Signed)
 Will check A1c with her other labs today.

## 2023-11-15 NOTE — Assessment & Plan Note (Signed)
 Stable on eletriptan  40 mg as needed and propranolol  ER 60 mg daily for prophylaxis.

## 2023-11-16 ENCOUNTER — Ambulatory Visit: Payer: Self-pay

## 2023-11-16 LAB — COMPREHENSIVE METABOLIC PANEL WITH GFR
ALT: 19 IU/L (ref 0–32)
AST: 18 IU/L (ref 0–40)
Albumin: 4.3 g/dL (ref 4.0–5.0)
Alkaline Phosphatase: 80 IU/L (ref 44–121)
BUN/Creatinine Ratio: 16 (ref 9–23)
BUN: 13 mg/dL (ref 6–20)
Bilirubin Total: 0.4 mg/dL (ref 0.0–1.2)
CO2: 20 mmol/L (ref 20–29)
Calcium: 9.1 mg/dL (ref 8.7–10.2)
Chloride: 103 mmol/L (ref 96–106)
Creatinine, Ser: 0.81 mg/dL (ref 0.57–1.00)
Globulin, Total: 2.3 g/dL (ref 1.5–4.5)
Glucose: 75 mg/dL (ref 70–99)
Potassium: 4.2 mmol/L (ref 3.5–5.2)
Sodium: 139 mmol/L (ref 134–144)
Total Protein: 6.6 g/dL (ref 6.0–8.5)
eGFR: 102 mL/min/{1.73_m2} (ref >59)

## 2023-11-16 LAB — CBC WITH DIFFERENTIAL/PLATELET
Basophils Absolute: 0 10*3/uL (ref 0.0–0.2)
Basos: 0 %
EOS (ABSOLUTE): 0.1 10*3/uL (ref 0.0–0.4)
Eos: 1 %
Hematocrit: 36.1 % (ref 34.0–46.6)
Hemoglobin: 11.5 g/dL (ref 11.1–15.9)
Immature Grans (Abs): 0 10*3/uL (ref 0.0–0.1)
Immature Granulocytes: 0 %
Lymphocytes Absolute: 1.6 10*3/uL (ref 0.7–3.1)
Lymphs: 23 %
MCH: 29 pg (ref 26.6–33.0)
MCHC: 31.9 g/dL (ref 31.5–35.7)
MCV: 91 fL (ref 79–97)
Monocytes Absolute: 0.4 10*3/uL (ref 0.1–0.9)
Monocytes: 6 %
Neutrophils Absolute: 4.8 10*3/uL (ref 1.4–7.0)
Neutrophils: 70 %
Platelets: 232 10*3/uL (ref 150–450)
RBC: 3.96 x10E6/uL (ref 3.77–5.28)
RDW: 13.4 % (ref 11.7–15.4)
WBC: 6.9 10*3/uL (ref 3.4–10.8)

## 2023-11-16 LAB — LIPID PANEL
Chol/HDL Ratio: 2.6 ratio (ref 0.0–4.4)
Cholesterol, Total: 164 mg/dL (ref 100–199)
HDL: 64 mg/dL (ref >39)
LDL Chol Calc (NIH): 82 mg/dL (ref 0–99)
Triglycerides: 98 mg/dL (ref 0–149)
VLDL Cholesterol Cal: 18 mg/dL (ref 5–40)

## 2023-11-16 LAB — HEMOGLOBIN A1C
Est. average glucose Bld gHb Est-mCnc: 100 mg/dL
Hgb A1c MFr Bld: 5.1 % (ref 4.8–5.6)

## 2023-11-16 LAB — VITAMIN D 25 HYDROXY (VIT D DEFICIENCY, FRACTURES): Vit D, 25-Hydroxy: 27.1 ng/mL — ABNORMAL LOW (ref 30.0–100.0)

## 2023-11-16 LAB — TSH: TSH: 1.31 u[IU]/mL (ref 0.450–4.500)

## 2023-11-20 ENCOUNTER — Ambulatory Visit: Admitting: Obstetrics and Gynecology

## 2023-11-29 ENCOUNTER — Other Ambulatory Visit (HOSPITAL_COMMUNITY)
Admission: RE | Admit: 2023-11-29 | Discharge: 2023-11-29 | Disposition: A | Discharge status: Home / Self Care | Source: Ambulatory Visit | Attending: Obstetrics and Gynecology | Admitting: Obstetrics and Gynecology

## 2023-11-29 ENCOUNTER — Ambulatory Visit: Admitting: Obstetrics and Gynecology

## 2023-11-29 ENCOUNTER — Encounter: Payer: Self-pay | Admitting: Obstetrics and Gynecology

## 2023-11-29 ENCOUNTER — Ambulatory Visit: Admitting: Obstetrics

## 2023-11-29 DIAGNOSIS — N6312 Unspecified lump in the right breast, upper inner quadrant: Secondary | ICD-10-CM

## 2023-11-29 DIAGNOSIS — Z124 Encounter for screening for malignant neoplasm of cervix: Secondary | ICD-10-CM | POA: Diagnosis not present

## 2023-11-29 DIAGNOSIS — Z8759 Personal history of other complications of pregnancy, childbirth and the puerperium: Secondary | ICD-10-CM | POA: Diagnosis not present

## 2023-11-29 DIAGNOSIS — Z8632 Personal history of gestational diabetes: Secondary | ICD-10-CM

## 2023-11-29 NOTE — Progress Notes (Signed)
 Post Partum Visit Note  Angela Shepard is a 28 y.o. G29P1001 female who presents for a postpartum visit. She is 7 weeks postpartum following a normal spontaneous vaginal delivery.  I have fully reviewed the prenatal and intrapartum course. The delivery was at 38 gestational weeks.  Anesthesia: epidural. Postpartum course has been good. Baby is doing well yes. Baby is feeding by bottle - Similac Neosure. Bleeding no bleeding. Bowel function is normal. Bladder function is normal. Patient is not sexually active. Contraception method is OCP Postpartum depression screening: negative.   The pregnancy intention screening data noted above was reviewed. Potential methods of contraception were discussed. The patient elected to proceed with OCP    Health Maintenance Due  Topic Date Due   HPV VACCINES (1 - 3-dose SCDM series) Never done   COVID-19 Vaccine (3 - 2024-25 season) 02/12/2023    The following portions of the patient's history were reviewed and updated as appropriate: allergies, current medications, past family history, past medical history, past social history, past surgical history, and problem list.  Review of Systems Pertinent items are noted in HPI.  Objective:  Wt 164 lb (74.4 kg)   LMP 01/12/2023 (Exact Date)   BMI 29.05 kg/m    General:  alert and cooperative   Breasts:  Mobile lump at 12oclock in the upper inner quadrant of right breast above areola , nontender, no erythema or rash  Lungs: Normal effort     Abdomen: Soft, non-distended   Wound N/a  GU exam:  Pelvic: normal appearing vulva with no masses, tenderness or lesions  VAGINA: normal appearing vagina with normal color and discharge, no lesions  CERVIX: normal appearing cervix without discharge or lesions, no CMT  Thin prep pap is done   Extremities:  No swelling or varicosities noted        Assessment:  1. Postpartum exam (Primary)  2. Cervical cancer screening  - Cytology - PAP( [place])  3.  History of gestational diabetes Recent pcp visit normal A1c   4. History of gestational hypertension Normotensive  5. Mass of upper inner quadrant of right breast Ultrasound ordered by pcp Discussed milk cyst vs fibroadenoma  Follow results    Plan:   Essential components of care per ACOG recommendations:  1.  Mood and well being: Patient with negative depression screening today. Reviewed local resources for support.  - Patient tobacco use? No.   - hx of drug use? No.    2. Infant care and feeding:  -Patient currently breastmilk feeding? No.  -Social determinants of health (SDOH) reviewed in EPIC. No concerns  3. Sexuality, contraception and birth spacing - Patient does not want a pregnancy in the next year.  children.  - Reviewed reproductive life planning. Reviewed contraceptive methods based on pt preferences and effectiveness.  Patient is on POPs today.   - Discussed birth spacing of 18 months  4. Sleep and fatigue -Encouraged family/partner/community support of 4 hrs of uninterrupted sleep to help with mood and fatigue  5. Physical Recovery  - Discussed patients delivery and complications. She describes her labor as good. - Patient had a Vaginal, no problems at delivery. Patient had a 1st degree laceration. Perineal healing reviewed. Patient expressed understanding - Patient has urinary incontinence? No. - Patient is safe to resume physical and sexual activity  6.  Health Maintenance - HM due items addressed Yes - Last pap smear  Diagnosis  Date Value Ref Range Status  09/22/2022 (A)  Final   -  Atypical squamous cells of undetermined significance (ASC-US )   Pap smear done at today's visit.  -Breast Cancer screening indicated? Ultrasound ordered by PCP   7. Chronic Disease/Pregnancy Condition follow up: None  - PCP follow up  Susi Eric, FNP [facility]

## 2023-11-29 NOTE — Progress Notes (Signed)
 PP visit.  Edinburgh negative.  PAP today. Declines STD screening.   Pt states she had a physical last week with PCP and a lump was noted in her right breast. Pt was told it was freely movable, but that a ultrasound was preferred. She has not yet been contacted for this referral.

## 2023-12-04 ENCOUNTER — Ambulatory Visit: Payer: Self-pay | Admitting: Obstetrics and Gynecology

## 2023-12-04 LAB — CYTOLOGY - PAP: Diagnosis: NEGATIVE

## 2023-12-07 ENCOUNTER — Ambulatory Visit: Payer: Self-pay

## 2023-12-07 NOTE — Telephone Encounter (Signed)
 FYI Only or Action Required?: FYI only for provider.  Patient was last seen in primary care on 11/08/2023 by Chandra Toribio POUR, MD. Called Nurse Triage reporting Medication Reaction. Symptoms began a week ago. Interventions attempted: Nothing. Symptoms are: unchanged.  Triage Disposition: See Physician Within 24 Hours  Patient/caregiver understands and will follow disposition?: Yes   Instructed pt to call GYN and if they can't get her in by tomorrow  then go to UC with in the next day.       Copied from CRM [id number]. Topic: Clinical - Medication Question >> Dec 07, 2023  9:02 AM Dawna HERO wrote: Reason for CRM: patient wants to know if she switch to a different birth control due to headaches and dizziness since she's been on it. Says she would like a call back if possible Reason for Disposition  [1] MODERATE dizziness (e.g., interferes with normal activities) AND [2] has NOT been evaluated by doctor (or NP/PA) for this  (Exception: Dizziness caused by heat exposure, sudden standing, or poor fluid intake.)  Answer Assessment - Initial Assessment Questions 1. DESCRIPTION: Describe your dizziness.     dizziness 2. LIGHTHEADED: Do you feel lightheaded? (e.g., somewhat faint, woozy, weak upon standing)     woozy 3. VERTIGO: Do you feel like either you or the room is spinning or tilting? (i.e. vertigo)     Head spinning 4. SEVERITY: How bad is it?  Do you feel like you are going to faint? Can you stand and walk?   - MILD: Feels slightly dizzy, but walking normally.   - MODERATE: Feels unsteady when walking, but not falling; interferes with normal activities (e.g., school, work).   - SEVERE: Unable to walk without falling, or requires assistance to walk without falling; feels like passing out now.      mild 5. ONSET:  When did the dizziness begin?     About a week ago 6. AGGRAVATING FACTORS: Does anything make it worse? (e.g., standing, change in head position)     no  8.  CAUSE: What do you think is causing the dizziness?     Unknown, could be birth control and she just started back vaping nicotine. 9. RECURRENT SYMPTOM: Have you had dizziness before? If Yes, ask: When was the last time? What happened that time?     no 10. OTHER SYMPTOMS: Do you have any other symptoms? (e.g., fever, chest pain, vomiting, diarrhea, bleeding)       headaches  Protocols used: Dizziness - Lightheadedness-A-AH

## 2023-12-08 ENCOUNTER — Other Ambulatory Visit: Payer: Self-pay

## 2023-12-08 MED ORDER — NORETHINDRONE 0.35 MG PO TABS: 1.0000 | ORAL_TABLET | Freq: Every day | ORAL | 11 refills | Status: AC

## 2023-12-08 NOTE — Telephone Encounter (Unsigned)
 Copied from CRM [id number]. Topic: Clinical - Medication Refill >> Dec 08, 2023 10:47 AM Emylou G wrote: Medication: incassia  0.35 mg  She was prescribed this from the hospital ( birth control )  Has the patient contacted their pharmacy? No (Agent: If no, request that the patient contact the pharmacy for the refill. If patient does not wish to contact the pharmacy document the reason why and proceed with request.) (Agent: If yes, when and what did the pharmacy advise?)  This is the patient's preferred pharmacy:   [phone] [facility] [place] - [address] [place] [address] Phone: [phone] Fax: [phone]  Is this the correct pharmacy for this prescription? Yes If no, delete pharmacy and type the correct one.   Has the prescription been filled recently? No  Is the patient out of the medication? Yes  Has the patient been seen for an appointment in the last year OR does the patient have an upcoming appointment? Yes  Can we respond through MyChart? Yes  Agent: Please be advised that Rx refills may take up to 3 business days. We ask that you follow-up with your pharmacy.

## 2023-12-08 NOTE — Telephone Encounter (Signed)
 Patient requesting slightly different oral contraception from that on profile. D/C summary from 10/10/2023 says to restart norethindrone , patient requesting Incassia , which also looks like norethindrone .

## 2023-12-11 ENCOUNTER — Other Ambulatory Visit: Payer: Self-pay | Admitting: Family Medicine

## 2023-12-13 ENCOUNTER — Ambulatory Visit: Admitting: Obstetrics and Gynecology

## 2024-02-09 ENCOUNTER — Other Ambulatory Visit: Payer: Self-pay | Admitting: Family Medicine

## 2024-03-01 ENCOUNTER — Telehealth: Admitting: Family Medicine

## 2024-03-01 ENCOUNTER — Telehealth

## 2024-03-01 DIAGNOSIS — J069 Acute upper respiratory infection, unspecified: Secondary | ICD-10-CM | POA: Diagnosis not present

## 2024-03-01 MED ORDER — FLUTICASONE PROPIONATE 50 MCG/ACT NA SUSP: 2.0000 | Freq: Every day | NASAL | 6 refills | Status: AC

## 2024-03-01 MED ORDER — DOXYCYCLINE HYCLATE 100 MG PO TABS
100.0000 mg | ORAL_TABLET | Freq: Two times a day (BID) | ORAL | 0 refills | Status: AC
Start: 2024-03-01 — End: 2024-03-11

## 2024-03-01 MED ORDER — BENZONATATE 200 MG PO CAPS: 200.0000 mg | ORAL_CAPSULE | Freq: Two times a day (BID) | ORAL | 0 refills | Status: DC | PRN

## 2024-03-01 NOTE — Patient Instructions (Signed)

## 2024-03-01 NOTE — Progress Notes (Signed)
 Virtual Visit Consent   LATORIE MONTESANO, you are scheduled for a virtual visit with a [facility] provider today. Just as with appointments in the office, your consent must be obtained to participate. Your consent will be active for this visit and any virtual visit you may have with one of our providers in the next 365 days. If you have a MyChart account, a copy of this consent can be sent to you electronically.  As this is a virtual visit, video technology does not allow for your provider to perform a traditional examination. This may limit your provider's ability to fully assess your condition. If your provider identifies any concerns that need to be evaluated in person or the need to arrange testing (such as labs, EKG, etc.), we will make arrangements to do so. Although advances in technology are sophisticated, we cannot ensure that it will always work on either your end or our end. If the connection with a video visit is poor, the visit may have to be switched to a telephone visit. With either a video or telephone visit, we are not always able to ensure that we have a secure connection.  By engaging in this virtual visit, you consent to the provision of healthcare and authorize for your insurance to be billed (if applicable) for the services provided during this visit. Depending on your insurance coverage, you may receive a charge related to this service.  I need to obtain your verbal consent now. Are you willing to proceed with your visit today? MEGUMI TREASTER has provided verbal consent on 03/01/2024 for a virtual visit (video or telephone). Loa Lamp, FNP  Date: 03/01/2024 4:07 PM   Virtual Visit via Video Note   I, Loa Lamp, connected with  SAYURI RHAMES  ([id number], 1996-01-21) on 03/01/24 at  4:00 PM EDT by a video-enabled telemedicine application and verified that I am speaking with the correct person using two identifiers.  Location: Patient: Virtual Visit Location Patient:  Home Provider: Virtual Visit Location Provider: Home Office   I discussed the limitations of evaluation and management by telemedicine and the availability of in person appointments. The patient expressed understanding and agreed to proceed.    History of Present Illness: Angela Shepard is a 28 y.o. who identifies as a female who was assigned female at birth, and is being seen today for head congestion, sinus pain and pressure, using flonase  and mucinex. Slight cough. No wheezing, sob or fever.  Weak feeling. Sx for 3 days. Not breastfeeding. Mucus yellow now. She is not pregnant or breastfeeding. She say sx are worsening.   HPI: HPI  Problems:  Patient Active Problem List   Diagnosis Date Noted   Mass of upper inner quadrant of right breast 11/15/2023   History of gestational hypertension 10/08/2023   History of gestational diabetes 07/31/2023   Impaired fasting glucose 09/22/2022   Vitamin D  deficiency 09/22/2022   Irritable bowel syndrome with diarrhea 05/16/2021   Generalized anxiety disorder 05/16/2021   Episodic migraine 11/08/2018    Allergies:  Allergies  Allergen Reactions   Amoxicillin    Penicillins Rash    Did it involve swelling of the face/tongue/throat, SOB, or low BP? Unknown Did it involve sudden or severe rash/hives, skin peeling, or any reaction on the inside of your mouth or nose? Unknown Did you need to seek medical attention at a hospital or doctor's office? Unknown When did it last happen?    Childhood   If all above  answers are "NO", may proceed with cephalosporin use.   Medications:  Current Outpatient Medications:    benzonatate  (TESSALON ) 200 MG capsule, Take 1 capsule (200 mg total) by mouth 2 (two) times daily as needed for cough., Disp: 20 capsule, Rfl: 0   doxycycline  (VIBRA -TABS) 100 MG tablet, Take 1 tablet (100 mg total) by mouth 2 (two) times daily for 10 days., Disp: 20 tablet, Rfl: 0   fluticasone  (FLONASE ) 50 MCG/ACT nasal spray, Place 2  sprays into both nostrils daily., Disp: 16 g, Rfl: 6   eletriptan  (RELPAX ) 40 MG tablet, TAKE 1 TAB AS NEEDED FOR MIGRAINE OR HEADACHE. MAY REPEAT IN 2 HOURS IF HEADACHE PERSISTS OR RECURS., Disp: 10 tablet, Rfl: 0   ibuprofen  (ADVIL ) 600 MG tablet, Take 1 tablet (600 mg total) by mouth every 6 (six) hours. (Patient not taking: Reported on 11/29/2023), Disp: 30 tablet, Rfl: 0   norethindrone  (ORTHO MICRONOR ) 0.35 MG tablet, Take 1 tablet (0.35 mg total) by mouth daily., Disp: 28 tablet, Rfl: 11   propranolol  ER (INDERAL  LA) 60 MG 24 hr capsule, TAKE 1 CAPSULE BY MOUTH EVERY DAY, Disp: 90 capsule, Rfl: 0  Observations/Objective: Patient is well-developed, well-nourished in no acute distress.  Resting comfortably  at home.  Head is normocephalic, atraumatic.  No labored breathing.  Speech is clear and coherent with logical content.  Patient is alert and oriented at baseline.    Assessment and Plan: 1. Viral URI with cough (Primary)  Discussed viral illness, she will hold atb for 2-3 days and start if sx persist or worsen. Increase fluids, allegra and flonase , rest humidifier at night, UC as needed.   Follow Up Instructions: I discussed the assessment and treatment plan with the patient. The patient was provided an opportunity to ask questions and all were answered. The patient agreed with the plan and demonstrated an understanding of the instructions.  A copy of instructions were sent to the patient via MyChart unless otherwise noted below.    The patient was advised to call back or seek an in-person evaluation if the symptoms worsen or if the condition fails to improve as anticipated.    Deajah Erkkila, FNP

## 2024-05-22 ENCOUNTER — Other Ambulatory Visit: Payer: Self-pay

## 2024-05-22 ENCOUNTER — Emergency Department (HOSPITAL_COMMUNITY)
Admission: EM | Admit: 2024-05-22 | Discharge: 2024-05-22 | Disposition: A | Discharge status: Home / Self Care | Payer: Worker's Compensation | Attending: Emergency Medicine | Admitting: Emergency Medicine

## 2024-05-22 ENCOUNTER — Encounter (HOSPITAL_COMMUNITY): Payer: Self-pay

## 2024-05-22 DIAGNOSIS — Z23 Encounter for immunization: Secondary | ICD-10-CM | POA: Insufficient documentation

## 2024-05-22 DIAGNOSIS — Z203 Contact with and (suspected) exposure to rabies: Secondary | ICD-10-CM

## 2024-05-22 DIAGNOSIS — Z2914 Encounter for prophylactic rabies immune globin: Secondary | ICD-10-CM | POA: Diagnosis present

## 2024-05-22 LAB — PREGNANCY, URINE: Preg Test, Ur: NEGATIVE

## 2024-05-22 MED ORDER — RABIES IMMUNE GLOBULIN 1500 UNIT/10ML IJ SOLN
20.0000 [IU]/kg | Freq: Once | INTRAMUSCULAR | Status: AC
  Administered 2024-05-22: 1500 [IU] via INTRAMUSCULAR
  Filled 2024-05-22: qty 10

## 2024-05-22 MED ORDER — RABIES VIRUS VACCINE, HDC IM SUSR
1.0000 mL | Freq: Once | INTRAMUSCULAR | Status: AC
  Administered 2024-05-22: 1 mL via INTRAMUSCULAR
  Filled 2024-05-22: qty 1

## 2024-05-22 NOTE — Discharge Instructions (Addendum)
 Thank you for letting us  evaluate you today.  We have given you the medical ABN and the rabies vaccine.  You are to return on 12/13, 12/17, 12/24 for repeat 1 dose vaccinations  Return to Emergency Department if you experience altered mentation, hallucinations, signs of infection, worsening symptoms

## 2024-05-22 NOTE — ED Provider Notes (Signed)
 [place] EMERGENCY DEPARTMENT AT [facility] Provider Note   CSN: [id number] Arrival date & time: 05/22/24  1814     Patient presents with: Rabies Exposure   Angela Shepard is a 28 y.o. female presents Emergency Department for rabies vaccination.  Was in contact with for the past month.  2 days ago, started acting strange and died the next day.  The cat tested positive for rabies.  Patient did not have any animal bite.  Has never been vaccinated for rabies.  Is not immunocompromise.  Last Tdap 2 years ago.  Denies AMS, lightheadedness, headache, aversion to water   HPI     Prior to Admission medications   Medication Sig Start Date End Date Taking? Authorizing Provider  benzonatate  (TESSALON ) 200 MG capsule Take 1 capsule (200 mg total) by mouth 2 (two) times daily as needed for cough. 03/01/24   Blair, Diane W, FNP  eletriptan  (RELPAX ) 40 MG tablet TAKE 1 TAB AS NEEDED FOR MIGRAINE OR HEADACHE. MAY REPEAT IN 2 HOURS IF HEADACHE PERSISTS OR RECURS. 12/11/23   Gayle Saddie FALCON, PA-C  fluticasone  (FLONASE ) 50 MCG/ACT nasal spray Place 2 sprays into both nostrils daily. 03/01/24   Blair, Diane W, FNP  ibuprofen  (ADVIL ) 600 MG tablet Take 1 tablet (600 mg total) by mouth every 6 (six) hours. Patient not taking: Reported on 11/29/2023 10/10/23   Leveque, Alyssa, MD  norethindrone  (ORTHO MICRONOR ) 0.35 MG tablet Take 1 tablet (0.35 mg total) by mouth daily. 12/08/23 12/07/24  Gayle Saddie FALCON, PA-C  propranolol  ER (INDERAL  LA) 60 MG 24 hr capsule TAKE 1 CAPSULE BY MOUTH EVERY DAY 02/09/24   Chandra Toribio POUR, MD    Allergies: Amoxicillin and Penicillins    Review of Systems  Skin:  Negative for wound.    Updated Vital Signs BP (!) 145/99   Pulse 81   Temp 98.2 F (36.8 C) (Oral)   Resp 19   Ht 5' 4 (1.626 m)   Wt 78.9 kg   SpO2 99%   BMI 29.87 kg/m   Physical Exam Vitals and nursing note reviewed.  Constitutional:      General: She is not in acute distress.    Appearance:  Normal appearance.  HENT:     Head: Normocephalic and atraumatic.  Eyes:     Conjunctiva/sclera: Conjunctivae normal.  Cardiovascular:     Rate and Rhythm: Normal rate.  Pulmonary:     Effort: Pulmonary effort is normal. No respiratory distress.  Skin:    Coloration: Skin is not jaundiced or pale.     Comments: No signs of infection nor injury to BUE nor BLE  Neurological:     Mental Status: She is alert and oriented to person, place, and time. Mental status is at baseline.     (all labs ordered are listed, but only abnormal results are displayed) Labs Reviewed  PREGNANCY, URINE    EKG: None  Radiology: No results found.    Medications Ordered in the ED  Rabies Immune Globulin  SOLN 1,500 Units (1,500 Units Intramuscular Given 05/22/24 2017)  rabies vaccine, human diploid (IMOVAX) injection 1 mL (1 mL Intramuscular Given 05/22/24 2016)                                    Medical Decision Making Amount and/or Complexity of Data Reviewed Labs: ordered.  Risk Prescription drug management.   Patient presents to the  ED for concern of rabies exposure, this involves an extensive number of treatment options, and is a complaint that carries with it a high risk of complications and morbidity.  The differential diagnosis includes infection, cellulitis, exposure to rabies   Co morbidities that complicate the patient evaluation  None   Additional history obtained:  Additional history obtained from Nursing   External records from outside source obtained and reviewed including triage note     Medicines ordered and prescription drug management:  I ordered medication including rabies vaccination, immunoglobulin  for rabies exposure Reevaluation of the patient after these medicines showed that the patient stayed the same I have reviewed the patients home medicines and have made adjustments as needed     Problem List / ED Course:  Rabies exposure  Vital signs  hemodynamically stable with no fever no tachycardia Last Tdap 2 years ago hCG negative No signs of infection nor injury.  Patient denies bite from animal. No need for infection prophylaxis currently Provided immunoglobulin, vaccine as patient has never been vaccinated.  Is not immunocompromised. Will have patient return for 3 additional vaccinations   Reevaluation:  After the interventions noted above, I reevaluated the patient and found that they have :stayed the same    Dispostion:  After consideration of the diagnostic results and the patients response to treatment, I feel that the patent would benefit from return on 12/13, 12/17, 12/24 for rabies vaccination.   Discussed ED workup, disposition, return to ED precautions with patient who expresses understanding agrees with plan.  All questions answered to their satisfaction.  They are agreeable to plan.  Discharge instructions provided on paperwork  Final diagnoses:  Rabies exposure    ED Discharge Orders     None        Angela Tinnie BRAVO, PA 05/22/24 2029    Franklyn Sid SAILOR, MD 05/22/24 2202

## 2024-05-22 NOTE — ED Triage Notes (Signed)
 Patient exposed to a cat that tested positive for rabies. Patient was not bitten.

## 2024-05-25 ENCOUNTER — Emergency Department (HOSPITAL_COMMUNITY)
Admission: EM | Admit: 2024-05-25 | Discharge: 2024-05-25 | Disposition: A | Discharge status: Home / Self Care | Payer: Worker's Compensation | Attending: Emergency Medicine | Admitting: Emergency Medicine

## 2024-05-25 DIAGNOSIS — Z23 Encounter for immunization: Secondary | ICD-10-CM

## 2024-05-25 DIAGNOSIS — Z2914 Encounter for prophylactic rabies immune globin: Secondary | ICD-10-CM | POA: Insufficient documentation

## 2024-05-25 DIAGNOSIS — Z203 Contact with and (suspected) exposure to rabies: Secondary | ICD-10-CM | POA: Insufficient documentation

## 2024-05-25 MED ORDER — RABIES VIRUS VACCINE, HDC IM SUSR
1.0000 mL | Freq: Once | INTRAMUSCULAR | Status: AC
  Administered 2024-05-25: 1 mL via INTRAMUSCULAR
  Filled 2024-05-25: qty 1

## 2024-05-25 NOTE — Discharge Instructions (Signed)
 It was a pleasure taking care of you today.  You did receive your second rabies vaccine  while you are in the emergency department.  Please see the attached instructions for the rest of your vaccine series.

## 2024-05-25 NOTE — ED Triage Notes (Signed)
 Patient in today reporting 2nd rabies vaccination due right side today.

## 2024-05-25 NOTE — ED Provider Notes (Signed)
 [place] EMERGENCY DEPARTMENT AT [facility] Provider Note   CSN: [id number] Arrival date & time: 05/25/24  9075     Patient presents with: Rabies Injection   Angela Shepard is a 28 y.o. female with no pertinent past medical history presents emergency department for second rabies vaccine . Patient had a rabies exposure at work and was here on Wednesday for initial vaccine. Patient has no immunosuppression. No hallucinations, injuries, fevers. No additional complaints.   HPI     Prior to Admission medications  Medication Sig Start Date End Date Taking? Authorizing Provider  benzonatate  (TESSALON ) 200 MG capsule Take 1 capsule (200 mg total) by mouth 2 (two) times daily as needed for cough. 03/01/24   Blair, Diane W, FNP  eletriptan  (RELPAX ) 40 MG tablet TAKE 1 TAB AS NEEDED FOR MIGRAINE OR HEADACHE. MAY REPEAT IN 2 HOURS IF HEADACHE PERSISTS OR RECURS. 12/11/23   Gayle Saddie FALCON, PA-C  fluticasone  (FLONASE ) 50 MCG/ACT nasal spray Place 2 sprays into both nostrils daily. 03/01/24   Blair, Diane W, FNP  ibuprofen  (ADVIL ) 600 MG tablet Take 1 tablet (600 mg total) by mouth every 6 (six) hours. Patient not taking: Reported on 11/29/2023 10/10/23   Leveque, Alyssa, MD  norethindrone  (ORTHO MICRONOR ) 0.35 MG tablet Take 1 tablet (0.35 mg total) by mouth daily. 12/08/23 12/07/24  Gayle Saddie FALCON, PA-C  propranolol  ER (INDERAL  LA) 60 MG 24 hr capsule TAKE 1 CAPSULE BY MOUTH EVERY DAY 02/09/24   Chandra Toribio POUR, MD    Allergies: Amoxicillin and Penicillins    Review of Systems  Skin:  Negative for wound.    Updated Vital Signs BP (!) 132/103 (BP Location: Left Arm)   Pulse 71   Temp 98.5 F (36.9 C) (Oral)   Resp 16   SpO2 100%   Physical Exam Vitals and nursing note reviewed.  Constitutional:      Appearance: Normal appearance.  HENT:     Head: Normocephalic and atraumatic.     Mouth/Throat:     Mouth: Mucous membranes are moist.  Eyes:     General: No scleral icterus.        Right eye: No discharge.        Left eye: No discharge.     Conjunctiva/sclera: Conjunctivae normal.  Cardiovascular:     Rate and Rhythm: Normal rate and regular rhythm.     Pulses: Normal pulses.  Pulmonary:     Effort: Pulmonary effort is normal.     Breath sounds: Normal breath sounds.  Abdominal:     General: There is no distension.     Tenderness: There is no abdominal tenderness.  Musculoskeletal:        General: No deformity.     Cervical back: Normal range of motion.  Skin:    General: Skin is warm and dry.     Capillary Refill: Capillary refill takes less than 2 seconds.  Neurological:     Mental Status: She is alert.     Motor: No weakness.  Psychiatric:        Mood and Affect: Mood normal.     (all labs ordered are listed, but only abnormal results are displayed) Labs Reviewed - No data to display  EKG: None  Radiology: No results found.  Procedures   Medications Ordered in the ED  rabies vaccine , human diploid (IMOVAX) injection 1 mL (has no administration in time range)  Medical Decision Making Risk Prescription drug management.   Patient presents emergency department for second rabies injection.  Differential diagnosis includes: Rabies exposure, infection, traumatic injury.  Patient had a positive rabies exposure at work earlier this week.  She was not bitten by the animal, and has no signs of injury but work is requiring her to get the rabies vaccine  series.  I ordered the second dose of the vaccine.  Following the rabies injection, there was no sign of allergic reaction.  No chest pain or shortness of breath.  Return precautions were given for subsequent rabies vaccines.  Patient's vital signs are stable.  Patient is appropriate for discharge at this time.     Final diagnoses:  Need for rabies vaccination    ED Discharge Orders     None          Torrence Marry RAMAN, PA-C 05/25/24 1143     Franklyn Sid SAILOR, MD 05/25/24 1444

## 2024-05-30 ENCOUNTER — Encounter (HOSPITAL_COMMUNITY): Payer: Self-pay

## 2024-05-30 ENCOUNTER — Other Ambulatory Visit: Payer: Self-pay

## 2024-05-30 ENCOUNTER — Emergency Department (HOSPITAL_COMMUNITY)
Admission: EM | Admit: 2024-05-30 | Discharge: 2024-05-30 | Disposition: A | Discharge status: Home / Self Care | Payer: Worker's Compensation | Source: Home / Self Care | Attending: Emergency Medicine | Admitting: Emergency Medicine

## 2024-05-30 DIAGNOSIS — Z23 Encounter for immunization: Secondary | ICD-10-CM | POA: Diagnosis present

## 2024-05-30 DIAGNOSIS — Y990 Civilian activity done for income or pay: Secondary | ICD-10-CM | POA: Insufficient documentation

## 2024-05-30 MED ORDER — RABIES VIRUS VACCINE, HDC IM SUSR
1.0000 mL | Freq: Once | INTRAMUSCULAR | Status: AC
  Administered 2024-05-30: 12:00:00 1 mL via INTRAMUSCULAR
  Filled 2024-05-30: qty 1

## 2024-05-30 NOTE — ED Provider Notes (Signed)
°  [place] EMERGENCY DEPARTMENT AT [facility] Provider Note   CSN: [id number] Arrival date & time: 05/30/24  1127     Patient presents with: No chief complaint on file.   Angela Shepard is a 28 y.o. female.   Patient here for third rabies vaccine .  She is asymptomatic.  She is tolerated previous 2 vaccines.  No other concerns otherwise.  The history is provided by the patient.       Prior to Admission medications  Medication Sig Start Date End Date Taking? Authorizing Provider  benzonatate  (TESSALON ) 200 MG capsule Take 1 capsule (200 mg total) by mouth 2 (two) times daily as needed for cough. 03/01/24   Blair, Diane W, FNP  eletriptan  (RELPAX ) 40 MG tablet TAKE 1 TAB AS NEEDED FOR MIGRAINE OR HEADACHE. MAY REPEAT IN 2 HOURS IF HEADACHE PERSISTS OR RECURS. 12/11/23   Gayle Saddie FALCON, PA-C  fluticasone  (FLONASE ) 50 MCG/ACT nasal spray Place 2 sprays into both nostrils daily. 03/01/24   Blair, Diane W, FNP  ibuprofen  (ADVIL ) 600 MG tablet Take 1 tablet (600 mg total) by mouth every 6 (six) hours. Patient not taking: Reported on 11/29/2023 10/10/23   Leveque, Alyssa, MD  norethindrone  (ORTHO MICRONOR ) 0.35 MG tablet Take 1 tablet (0.35 mg total) by mouth daily. 12/08/23 12/07/24  Gayle Saddie FALCON, PA-C  propranolol  ER (INDERAL  LA) 60 MG 24 hr capsule TAKE 1 CAPSULE BY MOUTH EVERY DAY 02/09/24   Chandra Toribio POUR, MD    Allergies: Amoxicillin and Penicillins    Review of Systems  Updated Vital Signs There were no vitals taken for this visit.  Physical Exam Neurological:     Mental Status: She is alert.     (all labs ordered are listed, but only abnormal results are displayed) Labs Reviewed - No data to display  EKG: None  Radiology: No results found.   Procedures   Medications Ordered in the ED  rabies vaccine , human diploid (IMOVAX) injection 1 mL (has no administration in time range)                                    Medical Decision  Making Risk Prescription drug management.   Angela Shepard is here for third shot and rabies vaccine  series.  She is tolerated the first 2 well.  She has unremarkable vitals.  She is asymptomatic.  Third vaccine given.  Patient discharged.  Understands to return for her fourth shot.  This chart was dictated using voice recognition software.  Despite best efforts to proofread,  errors can occur which can change the documentation meaning.      Final diagnoses:  Rabies vaccine  administered    ED Discharge Orders     None          Ruthe Cornet, DO 05/30/24 1133

## 2024-05-30 NOTE — ED Triage Notes (Signed)
 Pt here to receive 3rd rabies vaccine  in series. No complications with other injections. Pt was not bit or scratched, just exposed.

## 2024-06-07 ENCOUNTER — Emergency Department (HOSPITAL_COMMUNITY)
Admission: EM | Admit: 2024-06-07 | Discharge: 2024-06-07 | Disposition: A | Discharge status: Home / Self Care | Payer: Worker's Compensation | Attending: Emergency Medicine | Admitting: Emergency Medicine

## 2024-06-07 ENCOUNTER — Other Ambulatory Visit: Payer: Self-pay

## 2024-06-07 ENCOUNTER — Encounter (HOSPITAL_COMMUNITY): Payer: Self-pay

## 2024-06-07 DIAGNOSIS — Z23 Encounter for immunization: Secondary | ICD-10-CM | POA: Diagnosis not present

## 2024-06-07 DIAGNOSIS — Z203 Contact with and (suspected) exposure to rabies: Secondary | ICD-10-CM | POA: Diagnosis present

## 2024-06-07 MED ORDER — RABIES VIRUS VACCINE, HDC IM SUSR
1.0000 mL | Freq: Once | INTRAMUSCULAR | Status: AC
  Administered 2024-06-07: 1 mL via INTRAMUSCULAR
  Filled 2024-06-07: qty 1

## 2024-06-07 NOTE — ED Provider Notes (Signed)
 "  EMERGENCY DEPARTMENT AT [facility] Provider Note   CSN: [id number] Arrival date & time: 06/07/24  1119     Patient presents with: Rabies Injection   Angela Shepard is a 28 y.o. female.   Patient is a 28 year old female with significant past medical history presenting to the emergency department for fourth dose of series.  She was exposed to a cat with concern of rabies.  She states that she did not get bitten and has no wounds.  States that she is tolerating the other series well so far.  The history is provided by the patient.       Prior to Admission medications  Medication Sig Start Date End Date Taking? Authorizing Provider  benzonatate  (TESSALON ) 200 MG capsule Take 1 capsule (200 mg total) by mouth 2 (two) times daily as needed for cough. 03/01/24   Blair, Diane W, FNP  eletriptan  (RELPAX ) 40 MG tablet TAKE 1 TAB AS NEEDED FOR MIGRAINE OR HEADACHE. MAY REPEAT IN 2 HOURS IF HEADACHE PERSISTS OR RECURS. 12/11/23   Gayle Saddie FALCON, PA-C  fluticasone  (FLONASE ) 50 MCG/ACT nasal spray Place 2 sprays into both nostrils daily. 03/01/24   Blair, Diane W, FNP  ibuprofen  (ADVIL ) 600 MG tablet Take 1 tablet (600 mg total) by mouth every 6 (six) hours. Patient not taking: Reported on 11/29/2023 10/10/23   Leveque, Alyssa, MD  norethindrone  (ORTHO MICRONOR ) 0.35 MG tablet Take 1 tablet (0.35 mg total) by mouth daily. 12/08/23 12/07/24  Gayle Saddie FALCON, PA-C  propranolol  ER (INDERAL  LA) 60 MG 24 hr capsule TAKE 1 CAPSULE BY MOUTH EVERY DAY 02/09/24   Chandra Toribio POUR, MD    Allergies: Amoxicillin and Penicillins    Review of Systems  Updated Vital Signs BP 125/84 (BP Location: Left Arm)   Pulse 67   Temp 98.7 F (37.1 C) (Oral)   Resp 16   Ht 5' 4 (1.626 m)   Wt 78.9 kg   SpO2 100%   BMI 29.86 kg/m   Physical Exam Vitals and nursing note reviewed.  Constitutional:      General: She is not in acute distress.    Appearance: Normal appearance.  HENT:     Head:  Normocephalic.     Nose: Nose normal.  Eyes:     Extraocular Movements: Extraocular movements intact.  Pulmonary:     Effort: Pulmonary effort is normal.  Musculoskeletal:        General: Normal range of motion.     Cervical back: Normal range of motion.  Skin:    General: Skin is dry.  Neurological:     Mental Status: She is alert and oriented to person, place, and time.  Psychiatric:        Mood and Affect: Mood normal.        Behavior: Behavior normal.     (all labs ordered are listed, but only abnormal results are displayed) Labs Reviewed - No data to display  EKG: None  Radiology: No results found.   Procedures   Medications Ordered in the ED  rabies vaccine , human diploid (IMOVAX) injection 1 mL (has no administration in time range)                                    Medical Decision Making This patient presents to the ED with chief complaint(s) of need for rabies vaccination with no pertinent past medical  history which further complicates the presenting complaint. The complaint involves an extensive differential diagnosis and also carries with it a high risk of complications and morbidity.    The differential diagnosis includes rabies exposure, no wounds cellulitis or skin infection unlikely  Additional history obtained: Additional history obtained from N/A Records reviewed recent ED records  ED Course and Reassessment: On patient's arrival she is hemodynamically stable in no acute distress.  She is due for her fourth vaccine of her series and will be given vaccination here.  She has tolerated the remaining vaccine series well.  Was recommended outpatient follow-up as needed.  Independent labs interpretation:  N/A  Independent visualization of imaging: - N/A  Consultation: - Consulted or discussed management/test interpretation w/ external professional: N/A  Consideration for admission or further workup: Patient has no emergent conditions requiring  admission or further work-up at this time and is stable for discharge home with primary care follow-up  Social Determinants of health: N/A    Risk Prescription drug management.       Final diagnoses:  Need for rabies vaccination    ED Discharge Orders     None          Kingsley, Uzair Godley K, DO 06/07/24 1205  "

## 2024-06-07 NOTE — ED Triage Notes (Signed)
 Pt here for final rabies shot. No complications or issues.

## 2024-06-07 NOTE — Discharge Instructions (Signed)
 You were seen in the emergency department for your rabies vaccine .  You received the fourth and final dose of the series today.  You can follow-up with your primary doctor as needed and can return to the emergency department for any new or concerning symptoms.

## 2024-06-26 ENCOUNTER — Ambulatory Visit (INDEPENDENT_AMBULATORY_CARE_PROVIDER_SITE_OTHER): Admitting: Family Medicine

## 2024-06-26 ENCOUNTER — Ambulatory Visit: Payer: Self-pay

## 2024-06-26 ENCOUNTER — Encounter: Payer: Self-pay | Admitting: Family Medicine

## 2024-06-26 VITALS — BP 118/80 | HR 71 | Ht 64.0 in | Wt 172.8 lb

## 2024-06-26 DIAGNOSIS — R300 Dysuria: Secondary | ICD-10-CM | POA: Diagnosis not present

## 2024-06-26 LAB — POCT URINALYSIS DIP (CLINITEK)
Bilirubin, UA: NEGATIVE
Blood, UA: NEGATIVE
Glucose, UA: NEGATIVE mg/dL
Ketones, POC UA: NEGATIVE mg/dL
Nitrite, UA: NEGATIVE
POC PROTEIN,UA: NEGATIVE
Spec Grav, UA: 1.025
Urobilinogen, UA: 0.2 U/dL
pH, UA: 6

## 2024-06-26 MED ORDER — FLUCONAZOLE 150 MG PO TABS: ORAL_TABLET | ORAL | 0 refills | Status: AC

## 2024-06-26 MED ORDER — CEPHALEXIN 500 MG PO CAPS: 500.0000 mg | ORAL_CAPSULE | Freq: Two times a day (BID) | ORAL | 0 refills | Status: AC

## 2024-06-26 NOTE — Telephone Encounter (Signed)
 FYI Only or Action Required?: FYI only for provider: appointment scheduled on 06/26/2024 at 3:50pm at PCP office with Dr Toribio Slain.  Patient was last seen in primary care on 03/01/2024 by Blair, Diane W, FNP.  Called Nurse Triage reporting Dysuria.  Symptoms began yesterday.  Interventions attempted: Other: increase in water intake.  Symptoms are: gradually worsening.  Triage Disposition: See Physician Within 24 Hours  Patient/caregiver understands and will follow disposition?: Yes              Copied from CRM [id number]. Topic: Clinical - Red Word Triage >> Jun 26, 2024  7:50 AM Larissa RAMAN wrote: [facility] that prompted transfer to Nurse Triage: pain and burning with urination Reason for Disposition  All other patients with painful urination  (Exception: [1] EITHER frequency or urgency AND [2] has on-call doctor.)  Answer Assessment - Initial Assessment Questions Patient called and advised that yesterday she started having pain with urination (burning with urination) and it burned when she wiped. She states she has increased her water intake, is not retaining urine, denies any fevers, denies vomiting and states she had some left side pain before the burning with urination started but that has eased off. Patient rates her pain with urination 5-6 out of 10. Patient also denies any chance of pregnancy--states she had a baby almost 9 months ago and is on birth control now.  Appointment made for today 06/26/2024 at PCP office with Dr Toribio Slain at 3:50pm  Patient is advised to call us  back if anything changes or with any further questions/concerns. Patient is advised that if anything worsens to go to the Emergency Room. Patient verbalized understanding.  Protocols used: Urination Pain - Female-A-AH

## 2024-06-26 NOTE — Progress Notes (Signed)
" ° °  Established Patient Office Visit  Subjective   Patient ID: Angela Shepard, female    DOB: Jul 28, 1995  Age: 29 y.o. MRN: [id number]  Chief Complaint  Patient presents with   Vaginal Discharge     History of Present Illness   Angela Shepard is a 29 year old female who presents with UTI-like symptoms.  She has burning with urination and occasional discomfort, without itchiness or visible blood in the urine. She has had recurrent UTIs in the past but has not had one for several years.  She currently has a headache and had a cold with congestion about two weeks ago, which has resolved.  She previously had a kidney stone removed, but she feels her current symptoms are different from that episode. She is allergic to amoxicillin, which causes a rash. She has taken antibiotics for UTIs before and sometimes develops yeast infections afterward.  She works as a fish farm manager and recently received a rabies vaccine  series after exposure to a rabid cat.          The ASCVD Risk score (Arnett DK, et al., 2019) failed to calculate for the following reasons:   The 2019 ASCVD risk score is only valid for ages 34 to 75   * - Cholesterol units were assumed  Health Maintenance Due  Topic Date Due   Pneumococcal Vaccine (1 of 2 - PCV) Never done   Hepatitis B Vaccines 19-59 Average Risk (1 of 3 - 19+ 3-dose series) Never done   Influenza Vaccine  01/12/2024   COVID-19 Vaccine (3 - 2025-26 season) 02/12/2024      Objective:     BP 118/80   Pulse 71   Ht 5' 4 (1.626 m)   Wt 172 lb 12.8 oz (78.4 kg)   SpO2 98%   BMI 29.66 kg/m    Physical Exam     Gen: alert, oriented Pulm: no respiratory distress Psych: pleasant affect       Results for orders placed or performed in visit on 06/26/24  POCT URINALYSIS DIP (CLINITEK)  Result Value Ref Range   Color, UA yellow yellow   Clarity, UA clear clear   Glucose, UA negative negative mg/dL   Bilirubin, UA negative negative    Ketones, POC UA negative negative mg/dL   Spec Grav, UA 8.974 8.989 - 1.025   Blood, UA negative negative   pH, UA 6.0 5.0 - 8.0   POC PROTEIN,UA negative negative, trace   Urobilinogen, UA 0.2 0.2 or 1.0 E.U./dL   Nitrite, UA Negative Negative   Leukocytes, UA Small (1+) (A) Negative        Assessment & Plan:   Dysuria Assessment & Plan: Urinalysis indicates infection. Symptoms inconsistent with kidney stones. No fever or nausea. Allergy to amoxicillin noted. - Prescribed keflex . - Advised ibuprofen  or Azo for pain. - Instructed to complete antibiotics and report if no improvement.   Orders: -     POCT URINALYSIS DIP (CLINITEK)  Other orders -     Fluconazole ; Take 1 tablet by mouth for yeast infection.  Take second dose 72 hours later if not improved.  Dispense: 2 tablet; Refill: 0 -     Cephalexin ; Take 1 capsule (500 mg total) by mouth 2 (two) times daily for 5 days.  Dispense: 10 capsule; Refill: 0       Return if symptoms worsen or fail to improve.    Toribio MARLA Slain, MD  "

## 2024-06-26 NOTE — Patient Instructions (Signed)
 It was nice to see you today,  We addressed the following topics today: - if symptoms are not better after finishing antibiotics please let us  know  Have a great day,  Rolan Slain, MD

## 2024-06-26 NOTE — Assessment & Plan Note (Addendum)
 Urinalysis indicates infection. Symptoms inconsistent with kidney stones. No fever or nausea. Allergy to amoxicillin noted. - Prescribed keflex . - Advised ibuprofen  or Azo for pain. - Instructed to complete antibiotics and report if no improvement.

## 2024-06-27 ENCOUNTER — Encounter: Payer: Self-pay | Admitting: Family Medicine

## 2024-07-02 NOTE — Telephone Encounter (Signed)
 Called patient LVM to contact the office for an appt with Saddie if pt call please advised

## 2024-07-03 ENCOUNTER — Ambulatory Visit

## 2024-07-03 ENCOUNTER — Other Ambulatory Visit (HOSPITAL_COMMUNITY)
Admission: RE | Admit: 2024-07-03 | Discharge: 2024-07-03 | Disposition: A | Discharge status: Home / Self Care | Source: Ambulatory Visit

## 2024-07-03 VITALS — BP 124/85 | HR 67 | Ht 64.0 in | Wt 172.8 lb

## 2024-07-03 DIAGNOSIS — L301 Dyshidrosis [pompholyx]: Secondary | ICD-10-CM | POA: Insufficient documentation

## 2024-07-03 DIAGNOSIS — N898 Other specified noninflammatory disorders of vagina: Secondary | ICD-10-CM | POA: Diagnosis not present

## 2024-07-03 MED ORDER — TRIAMCINOLONE ACETONIDE 0.1 % EX CREA: 1.0000 | TOPICAL_CREAM | Freq: Two times a day (BID) | CUTANEOUS | 0 refills | Status: AC

## 2024-07-03 NOTE — Assessment & Plan Note (Signed)
 Chronic dyshidrotic eczema exacerbated by hand washing and cold weather.  Prescribed triacinolone 0.1% cream.  - Apply twice daily for two weeks, then as needed for flares.

## 2024-07-03 NOTE — Assessment & Plan Note (Addendum)
 2 singular vaginal lesions on left labia minora. Differentials include friction injury vs vaginitis vs HPV. Swabbed today for BV, yeast, GC/CZ. Also performed separate viral culture swab to test for HSV or HPV. Will follow up with patient via MyChart with results.  - Advised for now to continue with small amount of hydrocortisone 1% twice daily over the lesions  - If HPV returns positive, will send in Imiquimod to treat.  - Advised to follow up sooner if new symptoms or lesions arise.  - Will follow up with patient via MyChart

## 2024-07-03 NOTE — Progress Notes (Signed)
 "  Acute Office Visit  Subjective:     Patient ID: Angela Shepard, female    DOB: 1996-02-02, 29 y.o.   MRN: [id number]  Chief Complaint  Patient presents with   Vaginal Itching    HPI  Discussed the use of AI scribe software for clinical note transcription with the patient, who gave verbal consent to proceed.  History of Present Illness   Angela Shepard is a 29 year old female who presents with concerns about genital lesions.  Genital lesions - Two small spots present on the labia with sudden onset - Concern for possible infection due to new appearance - Monogamous relationship with fianc for six years  Recent urinary tract infection - Mild urinary tract infection occurred recently - Resolved after antibiotic treatment - Post-antibiotic itching developed, alleviated with fluconazole   Dyshidrotic eczema - Clear vesicles on fingers, frequently ruptured leading to skin damage - Flares with cold weather and frequent hand washing at work - Episodes result in bleeding of the hands - Seeking treatment options for eczema  Gynecologic screening - Pap smear performed after childbirth in June 2025         ROS Per HPI     Objective:    BP 124/85   Pulse 67   Ht 5' 4 (1.626 m)   Wt 172 lb 12 oz (78.4 kg)   SpO2 100%   BMI 29.65 kg/m    Physical Exam Constitutional:      General: She is not in acute distress.    Appearance: Normal appearance.  Cardiovascular:     Rate and Rhythm: Normal rate and regular rhythm.     Heart sounds: Normal heart sounds. No murmur heard.    No friction rub. No gallop.  Pulmonary:     Effort: Pulmonary effort is normal. No respiratory distress.     Breath sounds: Normal breath sounds.  Genitourinary:    Labia:        Right: Lesion (2 non-tender vesicular lesions present on left labia minora) present.      Vagina: Vaginal discharge (Mild, mucoid) present.  Musculoskeletal:        General: No swelling.  Skin:    General:  Skin is warm and dry.  Neurological:     General: No focal deficit present.     Mental Status: She is alert.  Psychiatric:        Mood and Affect: Mood normal.        Behavior: Behavior normal.        Thought Content: Thought content normal.      No results found for any visits on 07/03/24.      Assessment & Plan:   Vaginal lesion Assessment & Plan: 2 singular vaginal lesions on left labia minora. Differentials include friction injury vs vaginitis vs HPV. Swabbed today for BV, yeast, GC/CZ. Also performed separate viral culture swab to test for HSV or HPV. Will follow up with patient via MyChart with results.  - Advised for now to continue with small amount of hydrocortisone 1% twice daily over the lesions  - If HPV returns positive, will send in Imiquimod to treat.  - Advised to follow up sooner if new symptoms or lesions arise.  - Will follow up with patient via MyChart  Orders: -     Cervicovaginal ancillary only -     Virus culture  Dyshidrotic eczema Assessment & Plan: Chronic dyshidrotic eczema exacerbated by hand washing and cold weather.  Prescribed triacinolone 0.1%  cream.  - Apply twice daily for two weeks, then as needed for flares.   Other orders -     Triamcinolone  Acetonide; Apply 1 Application topically 2 (two) times daily.  Dispense: 60 g; Refill: 0     No follow-ups on file.  Angela JULIANNA Sacks, PA-C   "

## 2024-07-04 DIAGNOSIS — A749 Chlamydial infection, unspecified: Secondary | ICD-10-CM

## 2024-07-04 LAB — CERVICOVAGINAL ANCILLARY ONLY
Bacterial Vaginitis (gardnerella): NEGATIVE
Candida Glabrata: NEGATIVE
Candida Vaginitis: NEGATIVE
Chlamydia: POSITIVE — AB
Comment: NEGATIVE
Comment: NEGATIVE
Comment: NEGATIVE
Comment: NEGATIVE
Comment: NEGATIVE
Comment: NORMAL
Neisseria Gonorrhea: NEGATIVE
Trichomonas: NEGATIVE

## 2024-07-04 MED ORDER — DOXYCYCLINE HYCLATE 100 MG PO TABS: 100.0000 mg | ORAL_TABLET | Freq: Two times a day (BID) | ORAL | 0 refills | Status: DC

## 2024-07-04 NOTE — Telephone Encounter (Signed)
 Copied from CRM #[id number]. Topic: Clinical - Lab/Test Results >> Jul 04, 2024  3:12 PM Fonda T wrote: Reason for CRM: Pt calling to speak to nurse in regards to test results she has viewed via mychart.  Pt is requesting a return call to discuss further an din detail.  Per chart review, results have not been viewed by provider.   Can be reached at [phone].  Aware of call follow up call.

## 2024-07-04 NOTE — Telephone Encounter (Signed)
 Called and spoke with patient regarding recent positive chlamydia testing. Pt has only been sexually active with one partner. Explained that we would have to treat him too as an EPT to prevent re-infection of the patient.  Doxycyline 100 mg BID x 7 days, advised to take with food and finish antibiotic in its entirety. Advised patient to follow up if symptoms worsen or don't improve.  Obtained permission from patient to treat partner. Confirmed name and DOB of partner: Rockey Mixer DOB: 06/11/1991   Spoke with pharmacist at [facility] who advised to send 2 scripts under patient's name (Due to partner not being a patient here) and notate on one script the partner's name, DOB, and that treatment was for EPT.   Confirmed with patient that partner has NKDA. Advised to share admin recommendations with partner and to avoid sexual contact until antibiotic courses are completed.  Patient verbalized understanding and was in agreement with the plan.

## 2024-07-04 NOTE — Telephone Encounter (Signed)
 Looks like she has called and she has sent some communications via [employer]. Please advise.

## 2024-07-05 ENCOUNTER — Other Ambulatory Visit: Payer: Self-pay

## 2024-07-05 ENCOUNTER — Ambulatory Visit: Payer: Self-pay

## 2024-07-05 DIAGNOSIS — A749 Chlamydial infection, unspecified: Secondary | ICD-10-CM

## 2024-07-05 MED ORDER — DOXYCYCLINE HYCLATE 100 MG PO TABS: 100.0000 mg | ORAL_TABLET | Freq: Two times a day (BID) | ORAL | 0 refills | Status: DC

## 2024-07-10 ENCOUNTER — Other Ambulatory Visit: Payer: Self-pay

## 2024-07-10 DIAGNOSIS — A749 Chlamydial infection, unspecified: Secondary | ICD-10-CM

## 2024-07-10 MED ORDER — DOXYCYCLINE HYCLATE 100 MG PO TABS: 100.0000 mg | ORAL_TABLET | Freq: Two times a day (BID) | ORAL | 0 refills | Status: AC

## 2024-07-10 MED ORDER — DOXYCYCLINE HYCLATE 100 MG PO TABS: 100.0000 mg | ORAL_TABLET | Freq: Two times a day (BID) | ORAL | 0 refills | Status: DC

## 2024-07-10 MED ORDER — PROMETHAZINE-DM 6.25-15 MG/5ML PO SYRP: 5.0000 mL | ORAL_SOLUTION | Freq: Four times a day (QID) | ORAL | 0 refills | Status: AC | PRN

## 2024-07-10 NOTE — Addendum Note (Signed)
 Addended byBETHA GAYLE NUMBERS on: 07/10/2024 05:25 PM   Modules accepted: Orders

## 2024-07-11 LAB — VIRUS CULTURE
